# Patient Record
Sex: Female | Born: 1975
Health system: Southern US, Community
[De-identification: ages and names within clinical notes are randomized; demographics above are authoritative.]

## PROBLEM LIST (undated history)

## (undated) DIAGNOSIS — K219 Gastro-esophageal reflux disease without esophagitis: Secondary | ICD-10-CM

## (undated) DIAGNOSIS — E785 Hyperlipidemia, unspecified: Secondary | ICD-10-CM

## (undated) DIAGNOSIS — Z87442 Personal history of urinary calculi: Secondary | ICD-10-CM

## (undated) DIAGNOSIS — Z1509 Genetic susceptibility to other malignant neoplasm: Secondary | ICD-10-CM

## (undated) DIAGNOSIS — T7840XA Allergy, unspecified, initial encounter: Secondary | ICD-10-CM

## (undated) DIAGNOSIS — F419 Anxiety disorder, unspecified: Secondary | ICD-10-CM

## (undated) DIAGNOSIS — Z973 Presence of spectacles and contact lenses: Secondary | ICD-10-CM

## (undated) DIAGNOSIS — Z1507 Genetic susceptibility to malignant neoplasm of urinary tract: Secondary | ICD-10-CM

## (undated) HISTORY — DX: Anxiety disorder, unspecified: F41.9

## (undated) HISTORY — PX: DILATION AND CURETTAGE OF UTERUS: SHX78

## (undated) HISTORY — DX: Personal history of urinary calculi: Z87.442

## (undated) HISTORY — PX: BREAST ENHANCEMENT SURGERY: SHX7

## (undated) HISTORY — DX: Allergy, unspecified, initial encounter: T78.40XA

## (undated) HISTORY — PX: TONSILLECTOMY: SUR1361

## (undated) HISTORY — PX: BREAST IMPLANT REMOVAL: SUR1101

## (undated) HISTORY — DX: Hyperlipidemia, unspecified: E78.5

## (undated) HISTORY — PX: ABLATION: SHX5711

## (undated) HISTORY — DX: Gastro-esophageal reflux disease without esophagitis: K21.9

---

## 1993-11-27 HISTORY — PX: OTHER SURGICAL HISTORY: SHX169

## 2000-11-27 HISTORY — PX: DILATION AND CURETTAGE OF UTERUS: SHX78

## 2005-11-27 HISTORY — PX: BREAST ENHANCEMENT SURGERY: SHX7

## 2009-12-29 ENCOUNTER — Ambulatory Visit (HOSPITAL_COMMUNITY): Admission: RE | Admit: 2009-12-29 | Discharge: 2009-12-29 | Payer: Self-pay | Admitting: Obstetrics and Gynecology

## 2010-11-27 HISTORY — PX: ABLATION: SHX5711

## 2011-02-16 LAB — CBC
Hemoglobin: 11.9 g/dL — ABNORMAL LOW (ref 12.0–15.0)
MCHC: 33.7 g/dL (ref 30.0–36.0)
MCV: 88.2 fL (ref 78.0–100.0)
RDW: 13.3 % (ref 11.5–15.5)

## 2011-02-16 LAB — TYPE AND SCREEN: ABO/RH(D): A POS

## 2014-09-21 ENCOUNTER — Other Ambulatory Visit: Payer: Self-pay | Admitting: Obstetrics and Gynecology

## 2014-09-21 DIAGNOSIS — R928 Other abnormal and inconclusive findings on diagnostic imaging of breast: Secondary | ICD-10-CM

## 2014-10-06 ENCOUNTER — Ambulatory Visit
Admission: RE | Admit: 2014-10-06 | Discharge: 2014-10-06 | Disposition: A | Discharge status: Home / Self Care | Payer: No Typology Code available for payment source | Source: Ambulatory Visit | Attending: Obstetrics and Gynecology | Admitting: Obstetrics and Gynecology

## 2014-10-06 ENCOUNTER — Encounter (INDEPENDENT_AMBULATORY_CARE_PROVIDER_SITE_OTHER): Payer: Self-pay

## 2014-10-06 DIAGNOSIS — R928 Other abnormal and inconclusive findings on diagnostic imaging of breast: Secondary | ICD-10-CM

## 2014-11-27 HISTORY — PX: BREAST IMPLANT REMOVAL: SUR1101

## 2014-11-27 HISTORY — PX: AUGMENTATION MAMMAPLASTY: SUR837

## 2014-12-10 ENCOUNTER — Other Ambulatory Visit: Payer: Self-pay | Admitting: Dermatology

## 2016-12-01 ENCOUNTER — Other Ambulatory Visit: Payer: Self-pay | Admitting: Dermatology

## 2017-11-07 ENCOUNTER — Other Ambulatory Visit: Payer: Self-pay | Admitting: Obstetrics and Gynecology

## 2017-11-07 DIAGNOSIS — N644 Mastodynia: Secondary | ICD-10-CM

## 2017-11-07 DIAGNOSIS — N6310 Unspecified lump in the right breast, unspecified quadrant: Secondary | ICD-10-CM

## 2017-11-12 ENCOUNTER — Ambulatory Visit
Admission: RE | Admit: 2017-11-12 | Discharge: 2017-11-12 | Disposition: A | Discharge status: Home / Self Care | Payer: 59 | Source: Ambulatory Visit | Attending: Obstetrics and Gynecology | Admitting: Obstetrics and Gynecology

## 2017-11-12 ENCOUNTER — Other Ambulatory Visit: Payer: Self-pay

## 2017-11-12 DIAGNOSIS — N644 Mastodynia: Secondary | ICD-10-CM

## 2017-11-12 DIAGNOSIS — N6310 Unspecified lump in the right breast, unspecified quadrant: Secondary | ICD-10-CM

## 2018-02-11 ENCOUNTER — Encounter: Payer: Self-pay | Admitting: Gastroenterology

## 2018-03-26 ENCOUNTER — Ambulatory Visit: Payer: Self-pay | Admitting: Gastroenterology

## 2018-04-09 ENCOUNTER — Other Ambulatory Visit: Payer: Self-pay | Admitting: Family Medicine

## 2018-04-09 DIAGNOSIS — R1012 Left upper quadrant pain: Secondary | ICD-10-CM

## 2018-04-10 ENCOUNTER — Ambulatory Visit
Admission: RE | Admit: 2018-04-10 | Discharge: 2018-04-10 | Disposition: A | Discharge status: Home / Self Care | Payer: 59 | Source: Ambulatory Visit | Attending: Family Medicine | Admitting: Family Medicine

## 2018-04-10 ENCOUNTER — Other Ambulatory Visit: Payer: Self-pay | Admitting: Family Medicine

## 2018-04-10 DIAGNOSIS — R1032 Left lower quadrant pain: Secondary | ICD-10-CM

## 2018-04-10 DIAGNOSIS — R1012 Left upper quadrant pain: Secondary | ICD-10-CM

## 2018-05-16 ENCOUNTER — Encounter: Payer: Self-pay | Admitting: Gastroenterology

## 2018-05-16 ENCOUNTER — Encounter

## 2018-05-16 ENCOUNTER — Ambulatory Visit: Payer: 59 | Admitting: Gastroenterology

## 2018-05-16 VITALS — BP 120/66 | HR 76 | Ht 62.0 in | Wt 136.0 lb

## 2018-05-16 DIAGNOSIS — N200 Calculus of kidney: Secondary | ICD-10-CM

## 2018-05-16 DIAGNOSIS — R140 Abdominal distension (gaseous): Secondary | ICD-10-CM | POA: Diagnosis not present

## 2018-05-16 DIAGNOSIS — K824 Cholesterolosis of gallbladder: Secondary | ICD-10-CM

## 2018-05-16 DIAGNOSIS — R1032 Left lower quadrant pain: Secondary | ICD-10-CM

## 2018-05-16 DIAGNOSIS — K588 Other irritable bowel syndrome: Secondary | ICD-10-CM | POA: Diagnosis not present

## 2018-05-16 NOTE — Patient Instructions (Addendum)
You have been scheduled for a CT scan of the abdomen and pelvis at [facility] ([address]---this is in the same building as Press [facility]).   You are scheduled on 05/21/2018 at 11:15am. You should arrive 15 minutes prior to your appointment time for registration. Please follow the written instructions below on the day of your exam:  WARNING: IF YOU ARE ALLERGIC TO IODINE/X-RAY DYE, PLEASE NOTIFY RADIOLOGY IMMEDIATELY AT [phone]! YOU WILL BE GIVEN A 13 HOUR PREMEDICATION PREP.  1) Do not eat or drink anything after 7:15am (4 hours prior to your test) 2) You have been given 2 bottles of oral contrast to drink. The solution may taste better if refrigerated, but do NOT add ice or any other liquid to this solution. Shake well before drinking.    Drink 1 bottle of contrast @ 9:15am (2 hours prior to your exam)  Drink 1 bottle of contrast @ 10:15am (1 hour prior to your exam)  You may take any medications as prescribed with a small amount of water except for the following: Metformin, Glucophage, Glucovance, Avandamet, Riomet, Fortamet, Actoplus Met, Janumet, Glumetza or Metaglip. The above medications must be held the day of the exam AND 48 hours after the exam.  The purpose of you drinking the oral contrast is to aid in the visualization of your intestinal tract. The contrast solution may cause some diarrhea. Before your exam is started, you will be given a small amount of fluid to drink. Depending on your individual set of symptoms, you may also receive an intravenous injection of x-ray contrast/dye. Plan on being at [facility] for 30 minutes or longer, depending on the type of exam you are having performed.  This test typically takes 30-45 minutes to complete.  If you have any questions regarding your exam or if you need to reschedule, you may call the CT department at [phone] between the hours of 8:00 am and 5:00 pm,  Monday-Friday.  ________________________________________________________________________  We have given you IBGard samples, take 1 capsule three times a day as needed

## 2018-05-16 NOTE — Progress Notes (Signed)
Lauren Reynolds    [id number]    08-Jun-1976  Primary Care Physician:Helman, Viviann Spare, MD  Referring Physician: Rosalio Macadamia, MD [address] [place], [place] [address]  Chief complaint:  Abdominal pain, bloating  HPI: 42 year old female here for new patient visit with complaints of left-sided abdominal pain intermittently for the past 4 months.  She started having left lower quadrant abdominal pain since February 2019 with sometimes radiates down into her groin and also has had intermittent right lower quadrant abdominal pain.  Denies any association with activity or diet. No loss of appetite or weight loss.  No change in bowel habits, mucus or blood in stool.  Abdominal ultrasound Apr 10, 2018 showed 6 mm gallbladder polyp and nonobstructive 5 mm left renal calculus otherwise unremarkable exam. Abdominal x-ray and chest x-ray showed nonobstructive left renal calculi and mildly prominent stool in the proximal colon  No family history of GI malignancy  Outpatient Encounter Medications as of 05/16/2018  Medication Sig  . Cholecalciferol (VITAMIN D) 2000 units CAPS Take 1 capsule by mouth daily.  . fexofenadine (ALLEGRA) 180 MG tablet Take 180 mg by mouth daily.  . Nutritional Supplements (CHOLESTEROL DEFENSE PO) Take 2 tablets by mouth daily.   No facility-administered encounter medications on file as of 05/16/2018.     Allergies as of 05/16/2018 - Review Complete 05/16/2018  Allergen Reaction Noted  . Neosporin plus max st Rash 01/16/2013  . Benzalkonium chloride Rash 07/30/2015  . Codeine Nausea Only 01/16/2013    No past medical history on file.  Past Surgical History:  Procedure Laterality Date  . BREAST ENHANCEMENT SURGERY    . BREAST IMPLANT REMOVAL    . CESAREAN SECTION     x2    Family History  Problem Relation Age of Onset  . Breast cancer Maternal Aunt   . Esophageal cancer Maternal Uncle   . Breast cancer Paternal Aunt      Social History   Socioeconomic History  . Marital status: Married    Spouse name: Not on file  . Number of children: 2  . Years of education: Not on file  . Highest education level: Not on file  Occupational History  . Not on file  Social Needs  . Financial resource strain: Not on file  . Food insecurity:    Worry: Not on file    Inability: Not on file  . Transportation needs:    Medical: Not on file    Non-medical: Not on file  Tobacco Use  . Smoking status: Former Games developer  . Smokeless tobacco: Never Used  Substance and Sexual Activity  . Alcohol use: Yes  . Drug use: Never  . Sexual activity: Yes  Lifestyle  . Physical activity:    Days per week: Not on file    Minutes per session: Not on file  . Stress: Not on file  Relationships  . Social connections:    Talks on phone: Not on file    Gets together: Not on file    Attends religious service: Not on file    Active member of club or organization: Not on file    Attends meetings of clubs or organizations: Not on file    Relationship status: Not on file  . Intimate partner violence:    Fear of current or ex partner: Not on file    Emotionally abused: Not on file    Physically abused: Not on  file    Forced sexual activity: Not on file  Other Topics Concern  . Not on file  Social History Narrative  . Not on file      Review of systems: Review of Systems  Constitutional: Negative for fever and chills.  HENT: Negative.   Eyes: Negative for blurred vision.  Respiratory: Negative for cough, shortness of breath and wheezing.   Cardiovascular: Negative for chest pain and palpitations.  Gastrointestinal: as per HPI Genitourinary: Negative for dysuria, urgency, frequency and hematuria.  Musculoskeletal: Negative for myalgias, back pain and joint pain.  Skin: Negative for itching and rash.  Neurological: Negative for dizziness, tremors, focal weakness, seizures and loss of consciousness.  Endo/Heme/Allergies:  Positive for seasonal allergies.  Psychiatric/Behavioral: Negative for depression, suicidal ideas and hallucinations.  All other systems reviewed and are negative.   Physical Exam: Vitals:   05/16/18 0822  BP: 120/66  Pulse: 76   Body mass index is 24.87 kg/m. Gen:      No acute distress HEENT:  EOMI, sclera anicteric Neck:     No masses; no thyromegaly Lungs:    Clear to auscultation bilaterally; normal respiratory effort CV:         Regular rate and rhythm; no murmurs Abd:      + bowel sounds; soft, non-tender; no palpable masses, no distension Ext:    No edema; adequate peripheral perfusion Skin:      Warm and dry; no rash Neuro: alert and oriented x 3 Psych: normal mood and affect  Data Reviewed:  Reviewed labs, radiology imaging, old records and pertinent past GI work up  Abdominal X-ray 04/10/2018 Normal heart size, mediastinal contours, and pulmonary vascularity.  Mild central peribronchial thickening.  Lungs otherwise clear.  No infiltrate, pleural effusion or pneumothorax.  Prominent stool LEFT colon, scattered throughout remainder of colon.  Small bowel gas pattern normal.  No bowel dilatation, bowel wall thickening or free air.  Multiple tiny non-obstructing LEFT renal calculi.  No definite ureteral calcifications.  Tiny calcification in the inferior LEFT pelvis favor phleboliths.  Bones demineralized.  Abdominal Ultrasound 04/10/2018 Gallbladder: Dependent nonshadowing focus within gallbladder, non mobile, likely small polyp 6 mm diameter. No gallbladder wall thickening, pericholecystic fluid or sonographic Murphy sign.  Common bile duct: Diameter: 5 mm diameter, normal  Liver: Normal appearance. No focal hepatic mass or nodularity. Portal vein is patent on color Doppler imaging with normal direction of blood flow towards the liver.  IVC: Normal appearance  Pancreas: Normal appearance  Spleen: Normal appearance, 6.0 cm  length  Right Kidney: Length: 10.6 cm. Normal morphology without mass or hydronephrosis.  Left Kidney: Length: 10.9 cm. Echogenic focus with questionable shadowing mid inferior LEFT kidney question 5 mm nonobstructing calculus. No mass or hydronephrosis.  Abdominal aorta: Normal caliber  Other findings: No free fluid  Assessment and Plan/Recommendations:  42 year old female with recent onset left lower quadrant abdominal pain radiating to the groin  Abdominal ultrasound and x-ray showed renal calculi otherwise unremarkable.  Unclear etiology for recent onset left lower quadrant abdominal pain Will request CT abdomen pelvis with contrast to further evaluate  Abdominal ultrasound showed small 6 mm gallbladder polyp: We will schedule repeat abdominal ultrasound in 1 year for follow-up, if no change in size we will not recommend additional follow-up  Irritable bowel syndrome, constipation and bloating Increase fluid intake and fiber Trial of IBgard 1 capsule up to 3 times daily as needed  Greater than 50% of the time used for counseling as well  as treatment plan and follow-up. She had multiple questions which were answered to her satisfaction  K. Scherry RanVeena Kayelynn Abdou , MD [phone]    CC: Rosalio MacadamiaHelman, Steven, MD

## 2018-05-21 ENCOUNTER — Ambulatory Visit (INDEPENDENT_AMBULATORY_CARE_PROVIDER_SITE_OTHER)
Admission: RE | Admit: 2018-05-21 | Discharge: 2018-05-21 | Disposition: A | Discharge status: Home / Self Care | Payer: 59 | Source: Ambulatory Visit | Attending: Gastroenterology | Admitting: Gastroenterology

## 2018-05-21 DIAGNOSIS — K588 Other irritable bowel syndrome: Secondary | ICD-10-CM | POA: Diagnosis not present

## 2018-05-21 DIAGNOSIS — R140 Abdominal distension (gaseous): Secondary | ICD-10-CM

## 2018-05-21 DIAGNOSIS — R1032 Left lower quadrant pain: Secondary | ICD-10-CM | POA: Diagnosis not present

## 2018-05-21 DIAGNOSIS — K824 Cholesterolosis of gallbladder: Secondary | ICD-10-CM

## 2018-05-23 ENCOUNTER — Encounter: Payer: Self-pay | Admitting: Gastroenterology

## 2018-06-07 ENCOUNTER — Telehealth: Payer: Self-pay | Admitting: Gastroenterology

## 2018-06-07 NOTE — Telephone Encounter (Signed)
Pt instructed to call [facility] to reschedule the appt.

## 2018-06-07 NOTE — Telephone Encounter (Signed)
Pt called to let you know that she needs to r/s appt with urologist scheduled next week on 06/13/18.

## 2019-01-02 DIAGNOSIS — E559 Vitamin D deficiency, unspecified: Secondary | ICD-10-CM | POA: Diagnosis not present

## 2019-01-02 DIAGNOSIS — R7309 Other abnormal glucose: Secondary | ICD-10-CM | POA: Diagnosis not present

## 2019-01-02 DIAGNOSIS — Z01419 Encounter for gynecological examination (general) (routine) without abnormal findings: Secondary | ICD-10-CM | POA: Diagnosis not present

## 2019-01-02 DIAGNOSIS — R5383 Other fatigue: Secondary | ICD-10-CM | POA: Diagnosis not present

## 2019-01-02 DIAGNOSIS — Z6824 Body mass index (BMI) 24.0-24.9, adult: Secondary | ICD-10-CM | POA: Diagnosis not present

## 2019-01-02 DIAGNOSIS — E785 Hyperlipidemia, unspecified: Secondary | ICD-10-CM | POA: Diagnosis not present

## 2019-01-02 DIAGNOSIS — Z1231 Encounter for screening mammogram for malignant neoplasm of breast: Secondary | ICD-10-CM | POA: Diagnosis not present

## 2019-05-26 ENCOUNTER — Telehealth: Payer: Self-pay | Admitting: *Deleted

## 2019-05-26 DIAGNOSIS — K824 Cholesterolosis of gallbladder: Secondary | ICD-10-CM

## 2019-05-26 NOTE — Telephone Encounter (Signed)
===  View-only below this line=== ----- Message ----- From: Oda Kilts, CMA Sent: 04/28/2019 To: Oda Kilts, CMA Subject: [place] Recall                                      1 year recall ultrasound for gallbladder polyp

## 2019-05-26 NOTE — Telephone Encounter (Signed)
You have been scheduled for an abdominal ultrasound at [facility] (1st floor of hospital) on 06/03/2019 at [place]. Please arrive 15 minutes prior to your appointment for registration. Make certain not to have anything to eat or drink 6 hours prior to your appointment. Should you need to reschedule your appointment, please contact radiology at [phone]. This test typically takes about 30 minutes to perform.

## 2019-06-03 ENCOUNTER — Other Ambulatory Visit: Payer: Self-pay

## 2019-06-03 ENCOUNTER — Ambulatory Visit (HOSPITAL_COMMUNITY)
Admission: RE | Admit: 2019-06-03 | Discharge: 2019-06-03 | Disposition: A | Discharge status: Home / Self Care | Payer: BLUE CROSS/BLUE SHIELD | Source: Ambulatory Visit | Attending: Gastroenterology | Admitting: Gastroenterology

## 2019-06-03 DIAGNOSIS — K824 Cholesterolosis of gallbladder: Secondary | ICD-10-CM | POA: Diagnosis not present

## 2019-06-06 ENCOUNTER — Telehealth: Payer: Self-pay | Admitting: *Deleted

## 2019-06-06 NOTE — Telephone Encounter (Signed)
Spoke to patient, scheduled virtual visit for 07/22/2019 at 11:00 am.

## 2019-06-06 NOTE — Telephone Encounter (Signed)
This patient wanted to know what the plan of care was from here. Do you want to schedule another recall abd [place] for a year out, do you want to see this patient in the office to discuss? Please advise.

## 2019-06-06 NOTE — Telephone Encounter (Signed)
Please schedule a follow-up virtual visit to discuss her questions and concerns.  Radiology did not recommend follow-up ultrasound as the size of the polyp was stable.

## 2019-06-23 DIAGNOSIS — N951 Menopausal and female climacteric states: Secondary | ICD-10-CM | POA: Diagnosis not present

## 2019-06-23 DIAGNOSIS — N950 Postmenopausal bleeding: Secondary | ICD-10-CM | POA: Diagnosis not present

## 2019-07-22 ENCOUNTER — Other Ambulatory Visit: Payer: Self-pay

## 2019-07-22 ENCOUNTER — Ambulatory Visit (INDEPENDENT_AMBULATORY_CARE_PROVIDER_SITE_OTHER): Payer: BLUE CROSS/BLUE SHIELD | Admitting: Gastroenterology

## 2019-07-22 ENCOUNTER — Encounter: Payer: Self-pay | Admitting: Gastroenterology

## 2019-07-22 VITALS — Ht 62.0 in

## 2019-07-22 DIAGNOSIS — K824 Cholesterolosis of gallbladder: Secondary | ICD-10-CM | POA: Diagnosis not present

## 2019-07-22 DIAGNOSIS — R140 Abdominal distension (gaseous): Secondary | ICD-10-CM | POA: Diagnosis not present

## 2019-07-22 DIAGNOSIS — K588 Other irritable bowel syndrome: Secondary | ICD-10-CM | POA: Diagnosis not present

## 2019-07-22 DIAGNOSIS — R109 Unspecified abdominal pain: Secondary | ICD-10-CM

## 2019-07-22 NOTE — Patient Instructions (Addendum)
Follow-up abdominal ultrasound recall July 2021  Follow-up office visit in 1 year  Continue IBgard as needed  I appreciate the  opportunity to care for you  Thank You   Harl Bowie , MD

## 2019-07-22 NOTE — Progress Notes (Signed)
Lauren Reynolds    [id number]    1976-03-21  Primary Care Physician:Helman, Remo Lipps, MD  Referring Physician: Meda Klinefelter, MD No address on file  This service was provided via  telemedicine due to [place] 19 pandemic.  I connected with@ on 07/22/19 at 11:00 AM EDT by a video enabled telemedicine application and verified that I am speaking with the correct person using two identifiers.  Patient location: Home Provider location: Office   I discussed the limitations, risks, security and privacy concerns of performing an evaluation and management service by video enabled telemedicine application and the availability of in person appointments. I also discussed with the patient that there may be a patient responsible charge related to this service. The patient expressed understanding and agreed to proceed.   The persons participating in this telemedicine service were myself and the patient    Chief complaint: Gallbladder polyp HPI: 43 year old female here for follow-up visit  Abdominal ultrasound Apr 10, 2018 showed 6 mm gallbladder polyp and nonobstructive 5 mm left renal calculus otherwise unremarkable exam. Abdominal x-ray and chest x-ray showed nonobstructive left renal calculi and mildly prominent stool in the proximal colon  Repeat abdominal ultrasound July 2020 1. The gallbladder polyp again measures 5.8 mm, not significantly changed. By protocol, no imaging follow-up is recommended. 2. No other significant abnormalities.  She is doing well overall, no specific complaints.  Abdominal bloating and cramping intermittent improves with IBgard as needed.  Denies any nausea, vomiting, abdominal pain, melena or bright red blood per rectum   Outpatient Encounter Medications as of 07/22/2019  Medication Sig  . Cholecalciferol (VITAMIN D) 2000 units CAPS Take 1 capsule by mouth daily.  . fexofenadine (ALLEGRA) 180 MG tablet Take 180 mg by mouth daily.  .  Nutritional Supplements (CHOLESTEROL DEFENSE PO) Take 2 tablets by mouth daily.   No facility-administered encounter medications on file as of 07/22/2019.     Allergies as of 07/22/2019 - Review Complete 05/23/2018  Allergen Reaction Noted  . Neosporin plus max st Rash 01/16/2013  . Benzalkonium chloride Rash 07/30/2015  . Codeine Nausea Only 01/16/2013    No past medical history on file.  Past Surgical History:  Procedure Laterality Date  . BREAST ENHANCEMENT SURGERY    . BREAST IMPLANT REMOVAL    . CESAREAN SECTION     x2    Family History  Problem Relation Age of Onset  . Breast cancer Maternal Aunt   . Esophageal cancer Maternal Uncle   . Breast cancer Paternal Aunt     Social History   Socioeconomic History  . Marital status: Married    Spouse name: Not on file  . Number of children: 2  . Years of education: Not on file  . Highest education level: Not on file  Occupational History  . Not on file  Social Needs  . Financial resource strain: Not on file  . Food insecurity    Worry: Not on file    Inability: Not on file  . Transportation needs    Medical: Not on file    Non-medical: Not on file  Tobacco Use  . Smoking status: Former Research scientist (life sciences)  . Smokeless tobacco: Never Used  Substance and Sexual Activity  . Alcohol use: Yes  . Drug use: Never  . Sexual activity: Yes  Lifestyle  . Physical activity    Days per week: Not on file    Minutes per session: Not on file  .  Stress: Not on file  Relationships  . Social Musicianconnections    Talks on phone: Not on file    Gets together: Not on file    Attends religious service: Not on file    Active member of club or organization: Not on file    Attends meetings of clubs or organizations: Not on file    Relationship status: Not on file  . Intimate partner violence    Fear of current or ex partner: Not on file    Emotionally abused: Not on file    Physically abused: Not on file    Forced sexual activity: Not on file   Other Topics Concern  . Not on file  Social History Narrative  . Not on file      Review of systems: Review of Systems as per HPI All other systems reviewed and are negative.   Observations/Objective:   Data Reviewed:  Reviewed labs, radiology imaging, old records and pertinent past GI work up   Assessment and Plan/Recommendations:  43 year old female with irritable bowel syndrome and gallbladder polyp 5 to 6 mm in size stable compared to last year  We will repeat abdominal ultrasound in 1 year for follow-up of gallbladder polyp, if remains stable repeat ultrasound in 3 to 5 years  IBS: Continue IBgard as needed  Return office visit as needed  I discussed the assessment and treatment plan with the patient. The patient was provided an opportunity to ask questions and all were answered. The patient agreed with the plan and demonstrated an understanding of the instructions.   The patient was advised to call back or seek an in-person evaluation if the symptoms worsen or if the condition fails to improve as anticipated.  I provided 15 minutes of non-face-to-face time during this encounter.   Marsa ArisKavitha Cristal Qadir, MD   CC: Rosalio MacadamiaHelman, Steven, MD

## 2019-07-24 ENCOUNTER — Encounter: Payer: Self-pay | Admitting: Gastroenterology

## 2019-11-26 ENCOUNTER — Other Ambulatory Visit: Payer: Self-pay | Admitting: Radiology

## 2019-11-26 DIAGNOSIS — N63 Unspecified lump in unspecified breast: Secondary | ICD-10-CM

## 2019-11-26 DIAGNOSIS — N644 Mastodynia: Secondary | ICD-10-CM | POA: Diagnosis not present

## 2019-12-02 ENCOUNTER — Telehealth: Payer: Self-pay

## 2019-12-02 NOTE — Telephone Encounter (Signed)
Would you be willing to accept as a new patient?   Copied from CRM [id number]. Topic: Appointment Scheduling - Prior Auth Required for Appointment >> Dec 02, 2019  8:29 AM Reggie Pile, NT wrote: No appointment has been scheduled. Patient is requesting new pt with Dr.Burns appointment. Per scheduling protocol, this appointment requires a prior authorization prior to scheduling. Pt was recommended to her by her gastro MD.  Route to department's PEC pool.

## 2019-12-03 NOTE — Telephone Encounter (Signed)
Yes, I will accept her 

## 2019-12-04 NOTE — Telephone Encounter (Signed)
Patient scheduled for 12/17/2019 at 9:30am

## 2019-12-15 NOTE — Patient Instructions (Addendum)
Blood work was ordered.    All other Health Maintenance issues reviewed.   All recommended immunizations and age-appropriate screenings are up-to-date or discussed.  No immunization administered today.   Medications reviewed and updated.  Changes include :   none  Your prescription(s) have been submitted to your pharmacy. Please take as directed and contact our office if you believe you are having problem(s) with the medication(s).    Please followup in 1 year    Health Maintenance, Female Adopting a healthy lifestyle and getting preventive care are important in promoting health and wellness. Ask your health care provider about:  The right schedule for you to have regular tests and exams.  Things you can do on your own to prevent diseases and keep yourself healthy. What should I know about diet, weight, and exercise? Eat a healthy diet   Eat a diet that includes plenty of vegetables, fruits, low-fat dairy products, and lean protein.  Do not eat a lot of foods that are high in solid fats, added sugars, or sodium. Maintain a healthy weight Body mass index (BMI) is used to identify weight problems. It estimates body fat based on height and weight. Your health care provider can help determine your BMI and help you achieve or maintain a healthy weight. Get regular exercise Get regular exercise. This is one of the most important things you can do for your health. Most adults should:  Exercise for at least 150 minutes each week. The exercise should increase your heart rate and make you sweat (moderate-intensity exercise).  Do strengthening exercises at least twice a week. This is in addition to the moderate-intensity exercise.  Spend less time sitting. Even light physical activity can be beneficial. Watch cholesterol and blood lipids Have your blood tested for lipids and cholesterol at 46 years of age, then have this test every 5 years. Have your cholesterol levels checked more  often if:  Your lipid or cholesterol levels are high.  You are older than 46 years of age.  You are at high risk for heart disease. What should I know about cancer screening? Depending on your health history and family history, you may need to have cancer screening at various ages. This may include screening for:  Breast cancer.  Cervical cancer.  Colorectal cancer.  Skin cancer.  Lung cancer. What should I know about heart disease, diabetes, and high blood pressure? Blood pressure and heart disease  High blood pressure causes heart disease and increases the risk of stroke. This is more likely to develop in people who have high blood pressure readings, are of African descent, or are overweight.  Have your blood pressure checked: ? Every 3-5 years if you are 18-39 years of age. ? Every year if you are 40 years old or older. Diabetes Have regular diabetes screenings. This checks your fasting blood sugar level. Have the screening done:  Once every three years after age 40 if you are at a normal weight and have a low risk for diabetes.  More often and at a younger age if you are overweight or have a high risk for diabetes. What should I know about preventing infection? Hepatitis B If you have a higher risk for hepatitis B, you should be screened for this virus. Talk with your health care provider to find out if you are at risk for hepatitis B infection. Hepatitis C Testing is recommended for:  Everyone born from 1945 through 1965.  Anyone with known risk factors for hepatitis   C. Sexually transmitted infections (STIs)  Get screened for STIs, including gonorrhea and chlamydia, if: ? You are sexually active and are younger than 46 years of age. ? You are older than 46 years of age and your health care provider tells you that you are at risk for this type of infection. ? Your sexual activity has changed since you were last screened, and you are at increased risk for chlamydia  or gonorrhea. Ask your health care provider if you are at risk.  Ask your health care provider about whether you are at high risk for HIV. Your health care provider may recommend a prescription medicine to help prevent HIV infection. If you choose to take medicine to prevent HIV, you should first get tested for HIV. You should then be tested every 3 months for as long as you are taking the medicine. Pregnancy  If you are about to stop having your period (premenopausal) and you may become pregnant, seek counseling before you get pregnant.  Take 400 to 800 micrograms (mcg) of folic acid every day if you become pregnant.  Ask for birth control (contraception) if you want to prevent pregnancy. Osteoporosis and menopause Osteoporosis is a disease in which the bones lose minerals and strength with aging. This can result in bone fractures. If you are 65 years old or older, or if you are at risk for osteoporosis and fractures, ask your health care provider if you should:  Be screened for bone loss.  Take a calcium or vitamin D supplement to lower your risk of fractures.  Be given hormone replacement therapy (HRT) to treat symptoms of menopause. Follow these instructions at home: Lifestyle  Do not use any products that contain nicotine or tobacco, such as cigarettes, e-cigarettes, and chewing tobacco. If you need help quitting, ask your health care provider.  Do not use street drugs.  Do not share needles.  Ask your health care provider for help if you need support or information about quitting drugs. Alcohol use  Do not drink alcohol if: ? Your health care provider tells you not to drink. ? You are pregnant, may be pregnant, or are planning to become pregnant.  If you drink alcohol: ? Limit how much you use to 0-1 drink a day. ? Limit intake if you are breastfeeding.  Be aware of how much alcohol is in your drink. In the [place] one drink equals one 12 oz bottle of beer (355 mL), one 5 oz  glass of wine (148 mL), or one 1 oz glass of hard liquor (44 mL). General instructions  Schedule regular health, dental, and eye exams.  Stay current with your vaccines.  Tell your health care provider if: ? You often feel depressed. ? You have ever been abused or do not feel safe at home. Summary  Adopting a healthy lifestyle and getting preventive care are important in promoting health and wellness.  Follow your health care provider's instructions about healthy diet, exercising, and getting tested or screened for diseases.  Follow your health care provider's instructions on monitoring your cholesterol and blood pressure. This information is not intended to replace advice given to you by your health care provider. Make sure you discuss any questions you have with your health care provider. Document Revised: 11/06/2018 Document Reviewed: 11/06/2018 Elsevier Patient Education  2020 [employer]  

## 2019-12-15 NOTE — Progress Notes (Signed)
Subjective:    Patient ID: Lauren Reynolds, female    DOB: 03-06-1976, 44 y.o.   MRN: [id number]  HPI  She is here to establish with a new pcp.  She is here for a physical exam.   She has occasional pain from her right ear radiating down neck.  Not sure if it is related to grinding her teeth or not.  Feels it during day, not first thing in the mronig.  No swelling or lumps.  Wears mouth guard at night.   No other concerns.  Her 46 year old will be going away for boarding school in a couple of weeks.  Her son is 42.    Medications and allergies reviewed with patient and updated if appropriate.  There are no problems to display for this patient.   Current Outpatient Medications on File Prior to Visit  Medication Sig Dispense Refill  . Cholecalciferol (VITAMIN D) 2000 units CAPS Take 1 capsule by mouth daily.    . fexofenadine (ALLEGRA) 180 MG tablet Take 180 mg by mouth daily.     No current facility-administered medications on file prior to visit.    Past Medical History:  Diagnosis Date  . History of kidney stones     Past Surgical History:  Procedure Laterality Date  . ABLATION    . AUGMENTATION MAMMAPLASTY Bilateral 2016   Implants removed 2016  . BREAST ENHANCEMENT SURGERY    . BREAST IMPLANT REMOVAL    . CESAREAN SECTION     x2    Social History   Socioeconomic History  . Marital status: Married    Spouse name: Not on file  . Number of children: 2  . Years of education: Not on file  . Highest education level: Not on file  Occupational History  . Not on file  Tobacco Use  . Smoking status: Former Research scientist (life sciences)  . Smokeless tobacco: Never Used  Substance and Sexual Activity  . Alcohol use: Yes  . Drug use: Never  . Sexual activity: Yes  Other Topics Concern  . Not on file  Social History Narrative  . Not on file   Social Determinants of Health   Financial Resource Strain:   . Difficulty of Paying Living Expenses: Not on file  Food Insecurity:   .  Worried About Charity fundraiser in the Last Year: Not on file  . Ran Out of Food in the Last Year: Not on file  Transportation Needs:   . Lack of Transportation (Medical): Not on file  . Lack of Transportation (Non-Medical): Not on file  Physical Activity:   . Days of Exercise per Week: Not on file  . Minutes of Exercise per Session: Not on file  Stress:   . Feeling of Stress : Not on file  Social Connections:   . Frequency of Communication with Friends and Family: Not on file  . Frequency of Social Gatherings with Friends and Family: Not on file  . Attends Religious Services: Not on file  . Active Member of Clubs or Organizations: Not on file  . Attends Archivist Meetings: Not on file  . Marital Status: Not on file    Family History  Problem Relation Age of Onset  . Cancer Mother   . Breast cancer Maternal Aunt   . Esophageal cancer Maternal Uncle   . Breast cancer Paternal Aunt   . Cancer Paternal Grandmother     Review of Systems  Constitutional: Negative for chills,  fatigue and fever.       Occasional hot flashes  Eyes: Negative for visual disturbance.  Respiratory: Negative for cough, shortness of breath and wheezing.   Cardiovascular: Positive for palpitations (with stress - rare). Negative for chest pain and leg swelling.  Gastrointestinal: Negative for abdominal pain, blood in stool, constipation, diarrhea and nausea.  Genitourinary: Negative for dysuria and hematuria.  Musculoskeletal: Negative for arthralgias and back pain.  Skin: Negative for color change and rash.  Neurological: Negative for dizziness, light-headedness and headaches.  Psychiatric/Behavioral: Positive for dysphoric mood (mild, controlled with natural things) and sleep disturbance (takes natural supplement). The patient is nervous/anxious (mild, controlled with natural things).        Objective:   Vitals:   12/17/19 0924  BP: 134/82  Pulse: 83  Resp: 16  Temp: 98.9 F (37.2 C)   SpO2: 97%   Filed Weights   12/17/19 0924  Weight: 133 lb (60.3 kg)   Body mass index is 24.33 kg/m.  BP Readings from Last 3 Encounters:  12/17/19 134/82  05/16/18 120/66    Wt Readings from Last 3 Encounters:  12/17/19 133 lb (60.3 kg)  05/16/18 136 lb (61.7 kg)     Physical Exam Constitutional: She appears well-developed and well-nourished. No distress.  HENT:  Head: Normocephalic and atraumatic.  Right Ear: External ear normal. Normal ear canal and TM Left Ear: External ear normal.  Normal ear canal and TM Mouth/Throat: Oropharynx is clear and moist.  Eyes: Conjunctivae and EOM are normal.  Neck: Neck supple. No tracheal deviation present. No thyromegaly present.  No carotid bruit  Cardiovascular: Normal rate, regular rhythm and normal heart sounds.   No murmur heard.  No edema. Pulmonary/Chest: Effort normal and breath sounds normal. No respiratory distress. She has no wheezes. She has no rales.  Breast: deferred   Abdominal: Soft. She exhibits no distension. There is no tenderness.  Lymphadenopathy: She has no cervical adenopathy.  Skin: Skin is warm and dry. She is not diaphoretic.  Psychiatric: She has a normal mood and affect. Her behavior is normal.        Assessment & Plan:   Physical exam: Screening blood work    ordered Immunizations  tdap up to date, flu vaccine due Mammogram   Scheduled for 11/2019 Gyn  Up to date  Eye exams  Up to date  Exercise  Running, yoga, some weights Weight  Normal BMI Substance abuse  none  See Problem List for Assessment and Plan of chronic medical problems.     This visit occurred during the SARS-CoV-2 public health emergency.  Safety protocols were in place, including screening questions prior to the visit, additional usage of staff PPE, and extensive cleaning of exam room while observing appropriate contact time as indicated for disinfecting solutions.     FU in one year

## 2019-12-16 ENCOUNTER — Other Ambulatory Visit: Payer: Self-pay

## 2019-12-16 ENCOUNTER — Ambulatory Visit
Admission: RE | Admit: 2019-12-16 | Discharge: 2019-12-16 | Disposition: A | Discharge status: Home / Self Care | Payer: BLUE CROSS/BLUE SHIELD | Source: Ambulatory Visit | Attending: Radiology | Admitting: Radiology

## 2019-12-16 ENCOUNTER — Ambulatory Visit: Payer: BLUE CROSS/BLUE SHIELD

## 2019-12-16 DIAGNOSIS — R922 Inconclusive mammogram: Secondary | ICD-10-CM | POA: Diagnosis not present

## 2019-12-16 DIAGNOSIS — N63 Unspecified lump in unspecified breast: Secondary | ICD-10-CM

## 2019-12-16 DIAGNOSIS — N6012 Diffuse cystic mastopathy of left breast: Secondary | ICD-10-CM | POA: Diagnosis not present

## 2019-12-17 ENCOUNTER — Encounter: Payer: Self-pay | Admitting: Internal Medicine

## 2019-12-17 ENCOUNTER — Other Ambulatory Visit: Payer: Self-pay

## 2019-12-17 ENCOUNTER — Ambulatory Visit (INDEPENDENT_AMBULATORY_CARE_PROVIDER_SITE_OTHER): Payer: BLUE CROSS/BLUE SHIELD | Admitting: Internal Medicine

## 2019-12-17 VITALS — BP 134/82 | HR 83 | Temp 98.9°F | Resp 16 | Ht 62.0 in | Wt 133.0 lb

## 2019-12-17 DIAGNOSIS — E782 Mixed hyperlipidemia: Secondary | ICD-10-CM | POA: Diagnosis not present

## 2019-12-17 DIAGNOSIS — E7800 Pure hypercholesterolemia, unspecified: Secondary | ICD-10-CM | POA: Insufficient documentation

## 2019-12-17 DIAGNOSIS — J302 Other seasonal allergic rhinitis: Secondary | ICD-10-CM

## 2019-12-17 DIAGNOSIS — Z0000 Encounter for general adult medical examination without abnormal findings: Secondary | ICD-10-CM | POA: Diagnosis not present

## 2019-12-17 DIAGNOSIS — E785 Hyperlipidemia, unspecified: Secondary | ICD-10-CM | POA: Insufficient documentation

## 2019-12-17 DIAGNOSIS — L719 Rosacea, unspecified: Secondary | ICD-10-CM | POA: Insufficient documentation

## 2019-12-17 DIAGNOSIS — K824 Cholesterolosis of gallbladder: Secondary | ICD-10-CM | POA: Insufficient documentation

## 2019-12-17 DIAGNOSIS — Z87442 Personal history of urinary calculi: Secondary | ICD-10-CM | POA: Insufficient documentation

## 2019-12-17 DIAGNOSIS — J309 Allergic rhinitis, unspecified: Secondary | ICD-10-CM | POA: Insufficient documentation

## 2019-12-17 NOTE — Assessment & Plan Note (Signed)
Controlled with otc antihistamine  

## 2019-12-17 NOTE — Assessment & Plan Note (Signed)
Chronic Borderline in past Genetic Will start natural supplement Exercises regularly

## 2019-12-18 ENCOUNTER — Other Ambulatory Visit (INDEPENDENT_AMBULATORY_CARE_PROVIDER_SITE_OTHER): Payer: BLUE CROSS/BLUE SHIELD

## 2019-12-18 DIAGNOSIS — E782 Mixed hyperlipidemia: Secondary | ICD-10-CM

## 2019-12-18 DIAGNOSIS — Z0000 Encounter for general adult medical examination without abnormal findings: Secondary | ICD-10-CM | POA: Diagnosis not present

## 2019-12-18 LAB — LIPID PANEL
Cholesterol: 244 mg/dL — ABNORMAL HIGH (ref 0–200)
HDL: 79.3 mg/dL (ref >39.00)
LDL Cholesterol: 154 mg/dL — ABNORMAL HIGH (ref 0–99)
NonHDL: 164.4
Total CHOL/HDL Ratio: 3
Triglycerides: 53 mg/dL (ref 0.0–149.0)
VLDL: 10.6 mg/dL (ref 0.0–40.0)

## 2019-12-18 LAB — COMPREHENSIVE METABOLIC PANEL
ALT: 12 U/L (ref 0–35)
AST: 16 U/L (ref 0–37)
Albumin: 4.4 g/dL (ref 3.5–5.2)
Alkaline Phosphatase: 36 U/L — ABNORMAL LOW (ref 39–117)
BUN: 15 mg/dL (ref 6–23)
CO2: 29 mEq/L (ref 19–32)
Calcium: 9.1 mg/dL (ref 8.4–10.5)
Chloride: 104 mEq/L (ref 96–112)
Creatinine, Ser: 0.7 mg/dL (ref 0.40–1.20)
GFR: 90.18 mL/min (ref >60.00)
Glucose, Bld: 88 mg/dL (ref 70–99)
Potassium: 4.2 mEq/L (ref 3.5–5.1)
Sodium: 137 mEq/L (ref 135–145)
Total Bilirubin: 0.5 mg/dL (ref 0.2–1.2)
Total Protein: 7.1 g/dL (ref 6.0–8.3)

## 2019-12-18 LAB — CBC WITH DIFFERENTIAL/PLATELET
Basophils Absolute: 0 10*3/uL (ref 0.0–0.1)
Basophils Relative: 0.4 % (ref 0.0–3.0)
Eosinophils Absolute: 0.2 10*3/uL (ref 0.0–0.7)
Eosinophils Relative: 3.5 % (ref 0.0–5.0)
HCT: 37.7 % (ref 36.0–46.0)
Hemoglobin: 12.4 g/dL (ref 12.0–15.0)
Lymphocytes Relative: 32.7 % (ref 12.0–46.0)
Lymphs Abs: 1.8 10*3/uL (ref 0.7–4.0)
MCHC: 32.9 g/dL (ref 30.0–36.0)
MCV: 89.5 fl (ref 78.0–100.0)
Monocytes Absolute: 0.5 10*3/uL (ref 0.1–1.0)
Monocytes Relative: 8.7 % (ref 3.0–12.0)
Neutro Abs: 3.1 10*3/uL (ref 1.4–7.7)
Neutrophils Relative %: 54.7 % (ref 43.0–77.0)
Platelets: 242 10*3/uL (ref 150.0–400.0)
RBC: 4.21 Mil/uL (ref 3.87–5.11)
RDW: 13.3 % (ref 11.5–15.5)
WBC: 5.6 10*3/uL (ref 4.0–10.5)

## 2019-12-18 LAB — TSH: TSH: 3.01 u[IU]/mL (ref 0.35–4.50)

## 2019-12-19 ENCOUNTER — Encounter: Payer: Self-pay | Admitting: Internal Medicine

## 2020-01-28 DIAGNOSIS — Z6824 Body mass index (BMI) 24.0-24.9, adult: Secondary | ICD-10-CM | POA: Diagnosis not present

## 2020-01-28 DIAGNOSIS — Z01419 Encounter for gynecological examination (general) (routine) without abnormal findings: Secondary | ICD-10-CM | POA: Diagnosis not present

## 2020-01-28 DIAGNOSIS — Z803 Family history of malignant neoplasm of breast: Secondary | ICD-10-CM | POA: Diagnosis not present

## 2020-01-28 LAB — HM PAP SMEAR

## 2020-01-30 ENCOUNTER — Other Ambulatory Visit: Payer: Self-pay | Admitting: Obstetrics and Gynecology

## 2020-01-30 DIAGNOSIS — N6320 Unspecified lump in the left breast, unspecified quadrant: Secondary | ICD-10-CM

## 2020-01-30 DIAGNOSIS — N6310 Unspecified lump in the right breast, unspecified quadrant: Secondary | ICD-10-CM

## 2020-02-09 ENCOUNTER — Other Ambulatory Visit: Payer: Self-pay | Admitting: Obstetrics and Gynecology

## 2020-02-09 DIAGNOSIS — Z9189 Other specified personal risk factors, not elsewhere classified: Secondary | ICD-10-CM

## 2020-02-12 ENCOUNTER — Other Ambulatory Visit: Payer: BLUE CROSS/BLUE SHIELD

## 2020-03-10 DIAGNOSIS — Z9189 Other specified personal risk factors, not elsewhere classified: Secondary | ICD-10-CM | POA: Diagnosis not present

## 2020-06-01 ENCOUNTER — Other Ambulatory Visit: Payer: BLUE CROSS/BLUE SHIELD

## 2020-07-01 ENCOUNTER — Other Ambulatory Visit: Payer: Self-pay

## 2020-07-01 ENCOUNTER — Ambulatory Visit
Admission: RE | Admit: 2020-07-01 | Discharge: 2020-07-01 | Disposition: A | Discharge status: Home / Self Care | Payer: BLUE CROSS/BLUE SHIELD | Source: Ambulatory Visit | Attending: Obstetrics and Gynecology | Admitting: Obstetrics and Gynecology

## 2020-07-01 DIAGNOSIS — N6489 Other specified disorders of breast: Secondary | ICD-10-CM | POA: Diagnosis not present

## 2020-07-01 DIAGNOSIS — Z803 Family history of malignant neoplasm of breast: Secondary | ICD-10-CM | POA: Diagnosis not present

## 2020-07-01 DIAGNOSIS — Z9189 Other specified personal risk factors, not elsewhere classified: Secondary | ICD-10-CM

## 2020-07-01 MED ORDER — GADOBUTROL 1 MMOL/ML IV SOLN
6.0000 mL | Freq: Once | INTRAVENOUS | Status: AC | PRN
  Administered 2020-07-01: 6 mL via INTRAVENOUS

## 2020-07-06 IMAGING — MG DIGITAL DIAGNOSTIC BILAT W/ TOMO W/ CAD
8 of 17 series · 8 of 40 positions shown · non-contrast
Comparison: Previous exam(s).

CLINICAL DATA: Patient presents for evaluation of diffuse bilateral
breast tenderness.

EXAM:
DIGITAL DIAGNOSTIC BILATERAL MAMMOGRAM WITH CAD AND TOMO
ULTRASOUND LEFT BREAST

[R ML (1 of 2)]
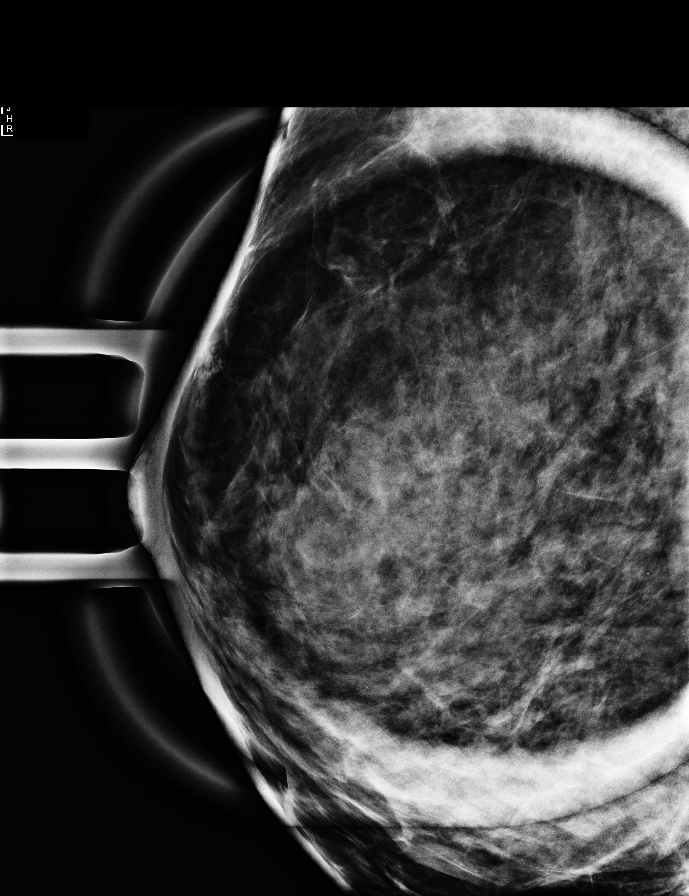

[R ML (2 of 2)]
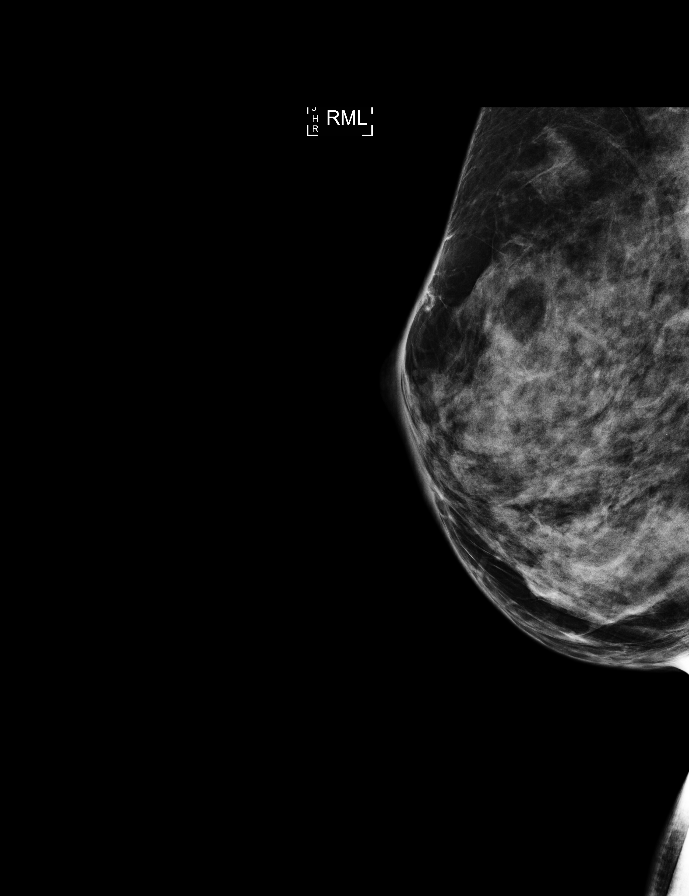

[L MLO synth-2D (1 of 2)]
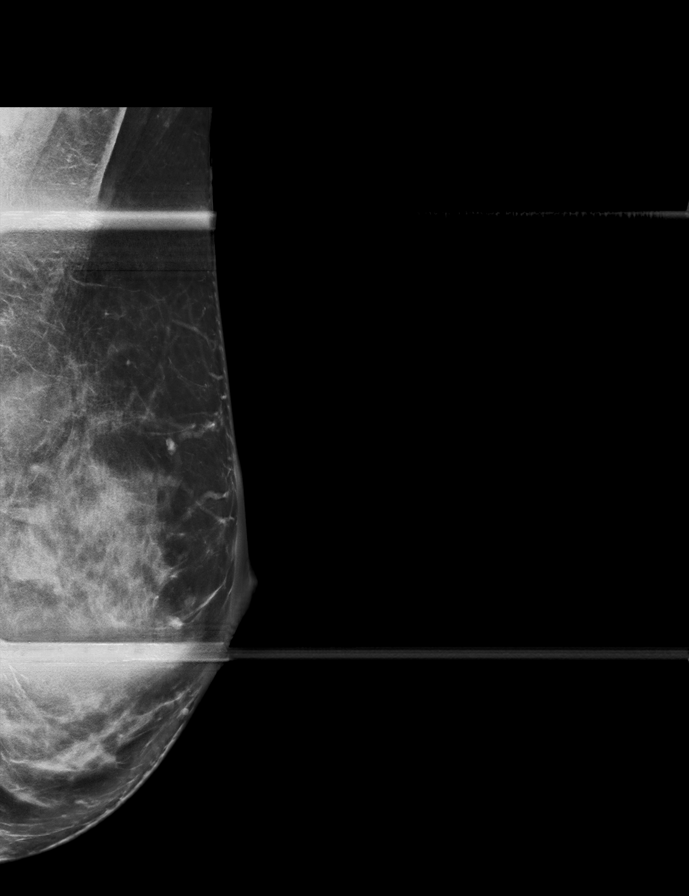

[L CC synth-2D]
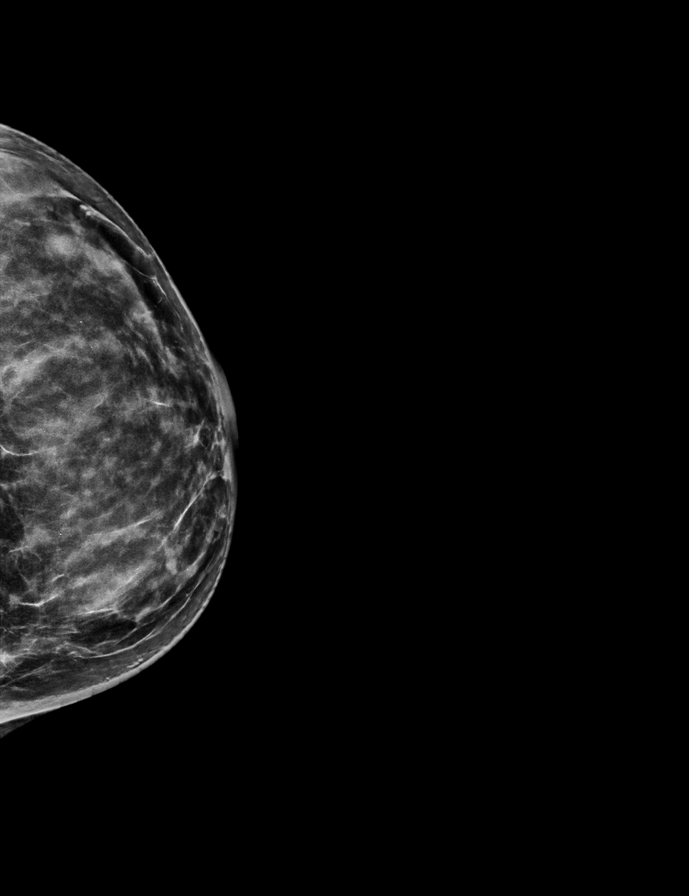

[R CC synth-2D]
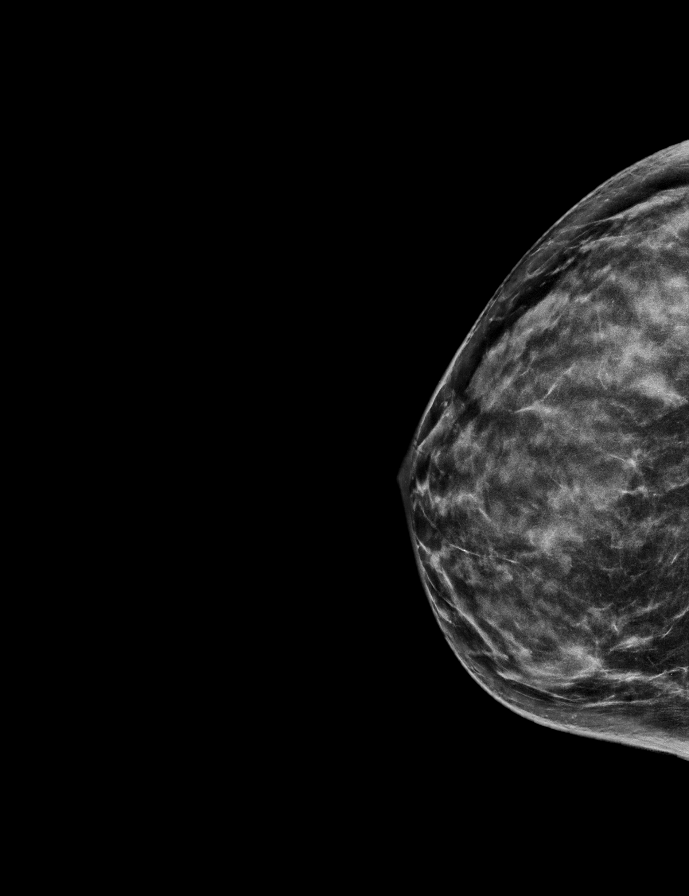

[L ML synth-2D]
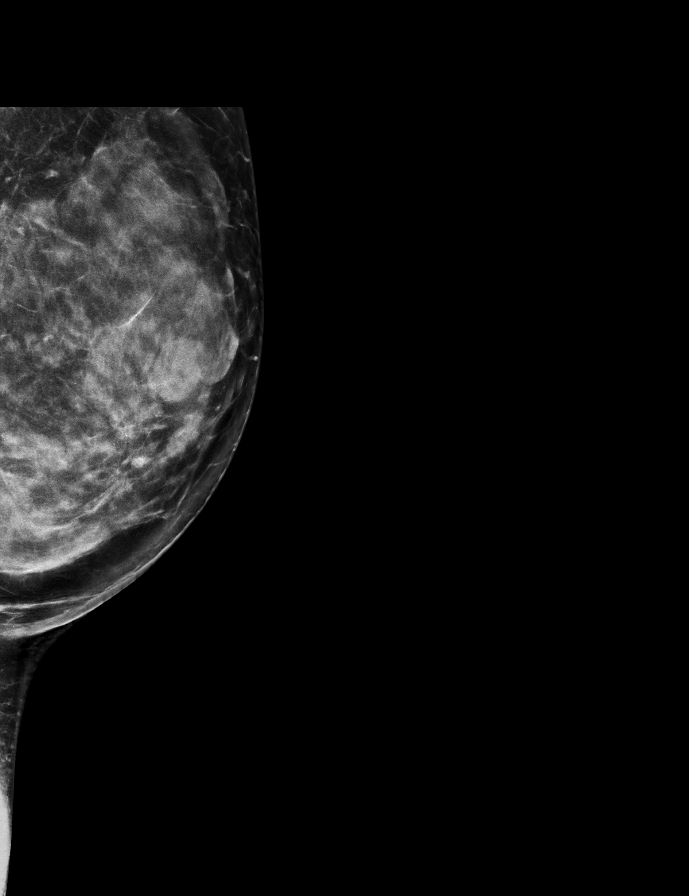

[L MLO synth-2D (2 of 2)]
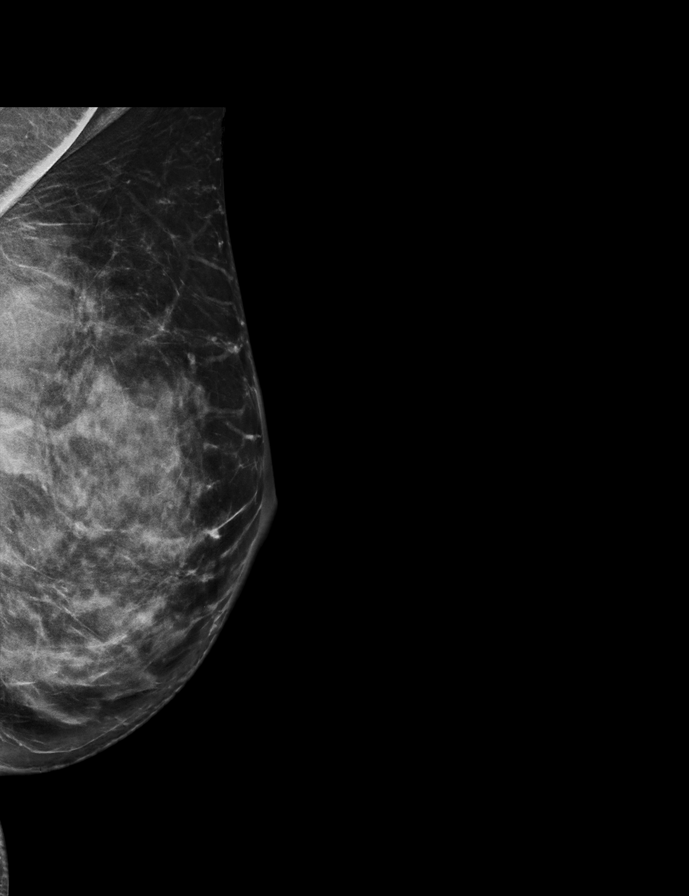

[R MLO synth-2D]
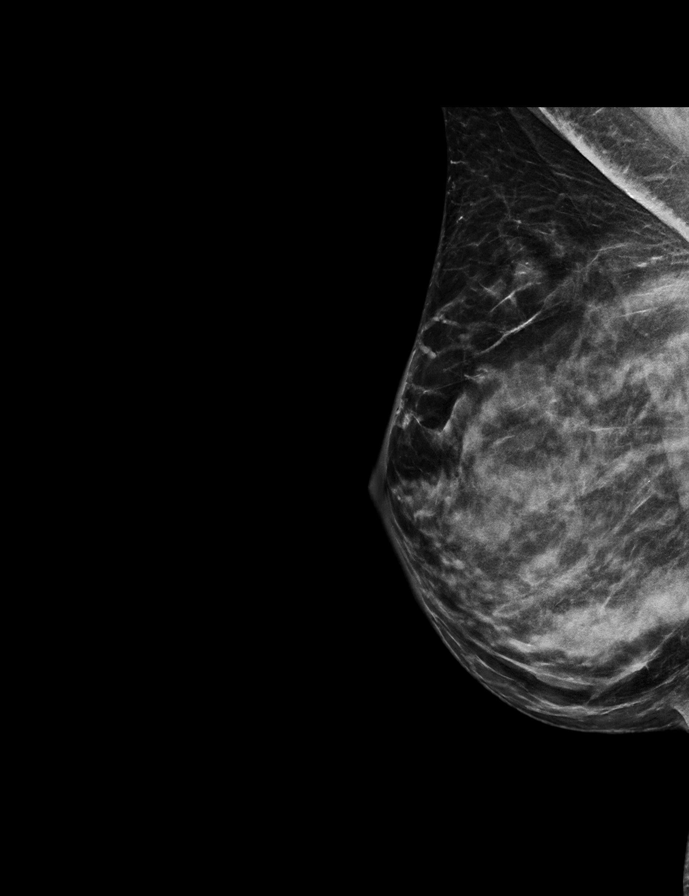

[8 of 40 positions shown; findings below may reference images not displayed]

ACR Breast Density Category c: The breast tissue is heterogeneously
dense, which may obscure small masses.
FINDINGS: There are at least 3 obscured masses demonstrated within the left
breast middle depth further evaluated with spot compression MLO
tomosynthesis images and full paddle true lateral tomosynthesis
images. No additional masses, calcifications or distortion
identified within either breast.

Mammographic images were processed with CAD.

Targeted ultrasound is performed, showing three adjacent cysts
within the left breast. There is a 2.0 x 2.3 x 1.5 cm cyst left
breast 2 o'clock position 5 cm from nipple. There is a 1.4 x 1.2 x
0.8 cm cyst left breast 2 o'clock position 5 cm from nipple. There
is a 1.9 x 1.4 x 1.8 cm simple cyst left breast 3 o'clock position 6
cm from nipple.
IMPRESSION: Multiple cysts left breast.

Otherwise no suspicious findings within the left or right breast to
explain diffuse bilateral breast tenderness.

No mammographic evidence for malignancy.

RECOMMENDATION:
Continued clinical evaluation for bilateral breast tenderness.

Screening mammogram in one year.(Code:O5-Z-OFJ)

I have discussed the findings and recommendations with the patient.
If applicable, a reminder letter will be sent to the patient
regarding the next appointment.

BI-RADS CATEGORY  2: Benign.

## 2020-08-18 ENCOUNTER — Telehealth: Payer: Self-pay | Admitting: *Deleted

## 2020-08-18 DIAGNOSIS — K824 Cholesterolosis of gallbladder: Secondary | ICD-10-CM

## 2020-08-18 NOTE — Telephone Encounter (Signed)
Patient needs 1 year f/u US for gallbladder polyp   You have been scheduled for an abdominal ultrasound at [facility] (1st floor of hospital) on 9/29 at 10:30am. Please arrive 15 minutes prior to your appointment for registration. Make certain not to have anything to eat or drink after midnight prior to your appointment. Should you need to reschedule your appointment, please contact radiology at [phone]. This test typically takes about 30 minutes to perform.  Left message for pt date and time of ultrasound

## 2020-08-25 ENCOUNTER — Ambulatory Visit (HOSPITAL_COMMUNITY)
Admission: RE | Admit: 2020-08-25 | Discharge: 2020-08-25 | Disposition: A | Discharge status: Home / Self Care | Payer: BLUE CROSS/BLUE SHIELD | Source: Ambulatory Visit | Attending: Gastroenterology | Admitting: Gastroenterology

## 2020-08-25 ENCOUNTER — Other Ambulatory Visit: Payer: Self-pay

## 2020-08-25 DIAGNOSIS — K824 Cholesterolosis of gallbladder: Secondary | ICD-10-CM | POA: Insufficient documentation

## 2020-09-02 NOTE — Progress Notes (Signed)
Subjective:    Patient ID: Lauren Reynolds, female    DOB: Jul 06, 1976, 44 y.o.   MRN: [id number]  HPI The patient is here for an acute visit for left-sided abdominal pain.   Left sided abdominal pain:  She states left sided abd pain.  She has had this pain in the past.  She saw GI and they have not yet figured out what the cause was.  She did try IBgard and that did not help.  She tried a few other things and they did not seem to help either.  She did do some intermittent fasting and her pain went away.  At 1 point she was eating more frequently and also did not have pain.  She has not noticed any obvious cause.   About 8 weeks ago -she went mountain biking and exercise at a more intense level than she is used to.  She also had a very active weekend.  After that she had significant leg pain and was very achy.  It was bad enough that she needed to take Advil.  The next morning she was very nauseous, but cannot make herself vomit.  That is when the left lower abdominal pain started again.  Pain was more intense for 2-3 days, but she has had persistent pain in that area since then.  The last couple of days it has been worse.    There has been increased stress and she thought maybe that is what it was related to, which is why she can come in sooner.  She drinks plenty of water, but increased her water intake.  She is increased her fiber.  The pain is persisted and she is not sure why.  She denies any changes in her bowels-no diarrhea or constipation.  There is no blood in her stool.  She denies any fevers.    She just had an abdominal ultrasound, which did not show any possible causes for her pain and was essentially normal.  She has had a couple of bad reactions to bee stings bee sting -- large reaction,  Stayed for weeks.  Yellow jacket - in same spot.  Still has discoloration after 10 days - did same thing but worse.  Reaction reached to almost knee.  She was unsure if that was normal or  not.   Medications and allergies reviewed with patient and updated if appropriate.  Patient Active Problem List   Diagnosis Date Noted  . Left lower quadrant abdominal pain 09/03/2020  . Allergic rhinitis 12/17/2019  . Hyperlipidemia 12/17/2019  . History of nephrolithiasis 12/17/2019  . Gallbladder polyp 12/17/2019  . Rosacea 12/17/2019    Current Outpatient Medications on File Prior to Visit  Medication Sig Dispense Refill  . Cholecalciferol (VITAMIN D) 2000 units CAPS Take 1 capsule by mouth daily.    . fexofenadine (ALLEGRA) 180 MG tablet Take 180 mg by mouth daily.     No current facility-administered medications on file prior to visit.    Past Medical History:  Diagnosis Date  . History of kidney stones     Past Surgical History:  Procedure Laterality Date  . ABLATION    . AUGMENTATION MAMMAPLASTY Bilateral 2016   Implants removed 2016  . BREAST ENHANCEMENT SURGERY    . BREAST IMPLANT REMOVAL    . CESAREAN SECTION     x2    Social History   Socioeconomic History  . Marital status: Married    Spouse name: Not on file  .  Number of children: 2  . Years of education: Not on file  . Highest education level: Not on file  Occupational History  . Not on file  Tobacco Use  . Smoking status: Former Games developer  . Smokeless tobacco: Never Used  . Tobacco comment: high school  Substance and Sexual Activity  . Alcohol use: Yes    Comment: 1 drink most nights  . Drug use: Never  . Sexual activity: Yes  Other Topics Concern  . Not on file  Social History Narrative  . Not on file   Social Determinants of Health   Financial Resource Strain:   . Difficulty of Paying Living Expenses: Not on file  Food Insecurity:   . Worried About Programme researcher, broadcasting/film/video in the Last Year: Not on file  . Ran Out of Food in the Last Year: Not on file  Transportation Needs:   . Lack of Transportation (Medical): Not on file  . Lack of Transportation (Non-Medical): Not on file  Physical  Activity:   . Days of Exercise per Week: Not on file  . Minutes of Exercise per Session: Not on file  Stress:   . Feeling of Stress : Not on file  Social Connections:   . Frequency of Communication with Friends and Family: Not on file  . Frequency of Social Gatherings with Friends and Family: Not on file  . Attends Religious Services: Not on file  . Active Member of Clubs or Organizations: Not on file  . Attends Banker Meetings: Not on file  . Marital Status: Not on file    Family History  Problem Relation Age of Onset  . Breast cancer Mother 82       triple neg  . Hyperlipidemia Mother   . Breast cancer Maternal Aunt   . Esophageal cancer Maternal Uncle   . Breast cancer Paternal Aunt   . Breast cancer Paternal Grandmother   . Thyroid nodules Sister     Review of Systems  Constitutional: Negative for appetite change, chills and fever.  Gastrointestinal: Positive for abdominal pain (LLQ). Negative for blood in stool (no black stool), constipation, diarrhea and nausea.       Increased gas  Genitourinary: Negative for dysuria, frequency and hematuria.       Objective:   Vitals:   09/03/20 0946  BP: 122/78  Pulse: 98  Temp: 98.2 F (36.8 C)  SpO2: 99%   BP Readings from Last 3 Encounters:  09/03/20 122/78  12/17/19 134/82  05/16/18 120/66   Wt Readings from Last 3 Encounters:  09/03/20 134 lb (60.8 kg)  12/17/19 133 lb (60.3 kg)  05/16/18 136 lb (61.7 kg)   Body mass index is 24.51 kg/m.   Physical Exam Constitutional:      Appearance: Normal appearance.  HENT:     Head: Normocephalic and atraumatic.  Abdominal:     General: There is no distension.     Palpations: Abdomen is soft. There is no mass.     Tenderness: There is no abdominal tenderness. There is no guarding or rebound.     Hernia: No hernia is present.  Musculoskeletal:     Right lower leg: No edema.     Left lower leg: No edema.  Skin:    General: Skin is warm and dry.   Neurological:     Mental Status: She is alert.        Reviewed Dr. Mayra Neer last office note.   Lab Results  Component  Value Date   WBC 5.2 09/03/2020   HGB 13.2 09/03/2020   HCT 38.7 09/03/2020   PLT 249.0 09/03/2020   GLUCOSE 96 09/03/2020   CHOL 244 (H) 12/18/2019   TRIG 53.0 12/18/2019   HDL 79.30 12/18/2019   LDLCALC 154 (H) 12/18/2019   ALT 13 09/03/2020   AST 17 09/03/2020   NA 139 09/03/2020   K 4.4 09/03/2020   CL 104 09/03/2020   CREATININE 0.75 09/03/2020   BUN 10 09/03/2020   CO2 29 09/03/2020   TSH 3.01 12/18/2019   US Abdomen Complete CLINICAL DATA:  Follow-up gallbladder polyp  EXAM: ABDOMEN ULTRASOUND COMPLETE  COMPARISON:  06/03/2019, 04/10/2018  FINDINGS: Gallbladder: No shadowing stone. Stable 5 mm gallbladder polyp. Negative for wall thickening or Murphy.  Common bile duct: Diameter: 4 mm  Liver: No focal lesion identified. Within normal limits in parenchymal echogenicity. Portal vein is patent on color Doppler imaging with normal direction of blood flow towards the liver.  IVC: No abnormality visualized.  Pancreas: Visualized portion unremarkable.  Spleen: Size and appearance within normal limits.  Right Kidney: Length: 10.5 cm. Echogenicity within normal limits. No mass or hydronephrosis visualized.  Left Kidney: Length: 10.3 cm. Echogenicity within normal limits. No mass or hydronephrosis visualized.  Abdominal aorta: No aneurysm visualized.  Other findings: None.  IMPRESSION: 1. Stable 5 mm gallbladder polyp. 2. Otherwise negative abdominal ultrasound  Electronically Signed   By: Jasmine Pang M.D.   On: 08/25/2020 15:58     Assessment & Plan:    See Problem List for Assessment and Plan of chronic medical problems.    This visit occurred during the SARS-CoV-2 public health emergency.  Safety protocols were in place, including screening questions prior to the visit, additional usage of staff PPE, and extensive  cleaning of exam room while observing appropriate contact time as indicated for disinfecting solutions.

## 2020-09-03 ENCOUNTER — Ambulatory Visit (INDEPENDENT_AMBULATORY_CARE_PROVIDER_SITE_OTHER): Payer: BLUE CROSS/BLUE SHIELD | Admitting: Internal Medicine

## 2020-09-03 ENCOUNTER — Ambulatory Visit (INDEPENDENT_AMBULATORY_CARE_PROVIDER_SITE_OTHER)
Admission: RE | Admit: 2020-09-03 | Discharge: 2020-09-03 | Disposition: A | Discharge status: Home / Self Care | Payer: BLUE CROSS/BLUE SHIELD | Source: Ambulatory Visit | Attending: Internal Medicine | Admitting: Internal Medicine

## 2020-09-03 ENCOUNTER — Other Ambulatory Visit: Payer: Self-pay

## 2020-09-03 ENCOUNTER — Encounter: Payer: Self-pay | Admitting: Internal Medicine

## 2020-09-03 ENCOUNTER — Telehealth: Payer: Self-pay | Admitting: Internal Medicine

## 2020-09-03 VITALS — BP 122/78 | HR 98 | Temp 98.2°F | Ht 62.0 in | Wt 134.0 lb

## 2020-09-03 DIAGNOSIS — R1012 Left upper quadrant pain: Secondary | ICD-10-CM | POA: Insufficient documentation

## 2020-09-03 DIAGNOSIS — Z23 Encounter for immunization: Secondary | ICD-10-CM | POA: Diagnosis not present

## 2020-09-03 DIAGNOSIS — R1032 Left lower quadrant pain: Secondary | ICD-10-CM

## 2020-09-03 DIAGNOSIS — T63444A Toxic effect of venom of bees, undetermined, initial encounter: Secondary | ICD-10-CM

## 2020-09-03 DIAGNOSIS — T63441A Toxic effect of venom of bees, accidental (unintentional), initial encounter: Secondary | ICD-10-CM | POA: Insufficient documentation

## 2020-09-03 DIAGNOSIS — K6389 Other specified diseases of intestine: Secondary | ICD-10-CM | POA: Diagnosis not present

## 2020-09-03 DIAGNOSIS — K3189 Other diseases of stomach and duodenum: Secondary | ICD-10-CM | POA: Diagnosis not present

## 2020-09-03 DIAGNOSIS — K429 Umbilical hernia without obstruction or gangrene: Secondary | ICD-10-CM | POA: Diagnosis not present

## 2020-09-03 DIAGNOSIS — N200 Calculus of kidney: Secondary | ICD-10-CM | POA: Diagnosis not present

## 2020-09-03 DIAGNOSIS — G8929 Other chronic pain: Secondary | ICD-10-CM | POA: Insufficient documentation

## 2020-09-03 LAB — CBC WITH DIFFERENTIAL/PLATELET
Basophils Absolute: 0 10*3/uL (ref 0.0–0.1)
Basophils Relative: 0.4 % (ref 0.0–3.0)
Eosinophils Absolute: 0.1 10*3/uL (ref 0.0–0.7)
Eosinophils Relative: 1.5 % (ref 0.0–5.0)
HCT: 38.7 % (ref 36.0–46.0)
Hemoglobin: 13.2 g/dL (ref 12.0–15.0)
Lymphocytes Relative: 27.1 % (ref 12.0–46.0)
Lymphs Abs: 1.4 10*3/uL (ref 0.7–4.0)
MCHC: 34.1 g/dL (ref 30.0–36.0)
MCV: 88.7 fl (ref 78.0–100.0)
Monocytes Absolute: 0.4 10*3/uL (ref 0.1–1.0)
Monocytes Relative: 7.1 % (ref 3.0–12.0)
Neutro Abs: 3.3 10*3/uL (ref 1.4–7.7)
Neutrophils Relative %: 63.9 % (ref 43.0–77.0)
Platelets: 249 10*3/uL (ref 150.0–400.0)
RBC: 4.36 Mil/uL (ref 3.87–5.11)
RDW: 13.3 % (ref 11.5–15.5)
WBC: 5.2 10*3/uL (ref 4.0–10.5)

## 2020-09-03 LAB — COMPREHENSIVE METABOLIC PANEL
ALT: 13 U/L (ref 0–35)
AST: 17 U/L (ref 0–37)
Albumin: 4.7 g/dL (ref 3.5–5.2)
Alkaline Phosphatase: 39 U/L (ref 39–117)
BUN: 10 mg/dL (ref 6–23)
CO2: 29 mEq/L (ref 19–32)
Calcium: 9.7 mg/dL (ref 8.4–10.5)
Chloride: 104 mEq/L (ref 96–112)
Creatinine, Ser: 0.75 mg/dL (ref 0.40–1.20)
GFR: 95.28 mL/min (ref >60.00)
Glucose, Bld: 96 mg/dL (ref 70–99)
Potassium: 4.4 mEq/L (ref 3.5–5.1)
Sodium: 139 mEq/L (ref 135–145)
Total Bilirubin: 0.6 mg/dL (ref 0.2–1.2)
Total Protein: 7.9 g/dL (ref 6.0–8.3)

## 2020-09-03 LAB — LIPASE: Lipase: 15 U/L (ref 11.0–59.0)

## 2020-09-03 LAB — AMYLASE: Amylase: 19 U/L — ABNORMAL LOW (ref 27–131)

## 2020-09-03 MED ORDER — AMOXICILLIN-POT CLAVULANATE 875-125 MG PO TABS: 1.0000 | ORAL_TABLET | Freq: Two times a day (BID) | ORAL | 0 refills | Status: DC

## 2020-09-03 MED ORDER — IOHEXOL 300 MG/ML  SOLN
100.0000 mL | Freq: Once | INTRAMUSCULAR | Status: AC | PRN
  Administered 2020-09-03: 100 mL via INTRAVENOUS

## 2020-09-03 MED ORDER — AMOXICILLIN-POT CLAVULANATE 875-125 MG PO TABS
1.0000 | ORAL_TABLET | Freq: Two times a day (BID) | ORAL | 0 refills | Status: DC
Start: 2020-09-03 — End: 2020-09-03

## 2020-09-03 NOTE — Assessment & Plan Note (Signed)
Recent bee sting reaction x2 that was more severe than she is used to Discussed that this can be a normal occurrence and medications that typically help No symptoms suggestive of any type of anaphylactic reaction

## 2020-09-03 NOTE — Telephone Encounter (Signed)
Please call her-let her know the CT scan shows that she has a portion of her small bowel that is inflamed.  This is called enteritis.  I do want to go ahead and have her take an antibiotic to see if that helps.  Her blood work is all normal.  She should follow-up with Dr. Lavon Paganini, but this is not urgent.  There is no evidence of diverticulosis or diverticulitis.  I like her to take Augmentin twice daily for 10 days.  I sent this to [facility] in [place] that is not the correct [facility] please resend.

## 2020-09-03 NOTE — Patient Instructions (Signed)
  Blood work was ordered.  Have this done downstairs.     Medications reviewed and updated.  Changes include :   none    A Ct scan of your abdomen was ordered.

## 2020-09-03 NOTE — Addendum Note (Signed)
Addended by: Karma Ganja on: 09/03/2020 01:08 PM   Modules accepted: Orders

## 2020-09-03 NOTE — Assessment & Plan Note (Signed)
Acute on chronic Has had some intermittent left-sided abdominal pain, but for the past 8 weeks has had persistent pain there, which has been worse in the past 2 days or so. Has seen GI in the past and work-up was in progress-there was a question about possibility of diverticulitis/osis, but this was not able to be evaluated prior to the pandemic starting Given the persistence of the pain further evaluation is needed.  We will get a CT of the abdomen and pelvis to rule out diverticulitis, but I would.  Surprised if she had something that lasted that long We will check CBC, CMP, lipase and amylase Will need to follow-up with GI no matter what the CT shows because eventually she should have a colonoscopy given her age and definitely considering her symptoms

## 2020-09-15 ENCOUNTER — Encounter: Payer: Self-pay | Admitting: Internal Medicine

## 2020-11-17 ENCOUNTER — Ambulatory Visit (INDEPENDENT_AMBULATORY_CARE_PROVIDER_SITE_OTHER): Payer: BLUE CROSS/BLUE SHIELD | Admitting: Gastroenterology

## 2020-11-17 ENCOUNTER — Encounter: Payer: Self-pay | Admitting: Gastroenterology

## 2020-11-17 VITALS — BP 132/72 | HR 76 | Ht 62.0 in | Wt 136.0 lb

## 2020-11-17 DIAGNOSIS — Z1211 Encounter for screening for malignant neoplasm of colon: Secondary | ICD-10-CM

## 2020-11-17 DIAGNOSIS — K581 Irritable bowel syndrome with constipation: Secondary | ICD-10-CM

## 2020-11-17 DIAGNOSIS — R1032 Left lower quadrant pain: Secondary | ICD-10-CM

## 2020-11-17 MED ORDER — SUPREP BOWEL PREP KIT 17.5-3.13-1.6 GM/177ML PO SOLN
1.0000 | ORAL | 0 refills | Status: DC
Start: 2020-11-17 — End: 2020-12-27

## 2020-11-17 MED ORDER — DICYCLOMINE HCL 20 MG PO TABS: 20.0000 mg | ORAL_TABLET | Freq: Three times a day (TID) | ORAL | 6 refills | Status: DC | PRN

## 2020-11-17 NOTE — Progress Notes (Signed)
EARNESTEEN BIRNIE    [id number]    21-Jul-1976  Primary Care Physician:Burns, Bobette Mo, MD  Referring Physician: Pincus Sanes, MD [address] [place],  [place] [address]   Chief complaint:  Abdominal pain  HPI:  44 year old very pleasant female here for follow-up visit for abdominal pain  Left side abdominal pain, since October.  Initially was constant severe pain on the left side, she could point to a specific spot.  Over the last few weeks it is improving and is intermittent.  Some improvement with probiotics. She was treated with course of antibiotics by Dr. Lawerance Bach for long segment segment thickening of small bowel, she did not notice any significant improvement after antibiotics.  She is using IBgard and Tylenol as needed  Denies any straining with exercise or lifting heavy weights.  No change in dietary habits or bowel habits.  No weight loss.  No blood in stool.  CT abdomen and pelvis with contrast September 03, 2020: Mild long segment thickening of small bowel loops, likely overread by radiologist could be secondary to small bowel peristalsis/contraction as there was no associated stranding or free fluid. Bilateral nonobstructing nephrolithiasis L>>R  Abdominal ultrasound August 25, 2020: Stable 5 mm gallbladder polyp, no gallstones or gallbladder wall thickening.  Abdominal ultrasound Apr 10, 2018 showed 6 mm gallbladder polyp and nonobstructive 5 mm left renal calculus otherwise unremarkable exam. Abdominal x-ray and chest x-ray showed nonobstructive left renal calculi and mildly prominent stool in the proximal colon  Repeat abdominal ultrasound July 2020 1. The gallbladder polyp again measures 5.8 mm, not significantly changed. By protocol, no imaging follow-up is recommended. 2. No other significant abnormalities.   Outpatient Encounter Medications as of 11/17/2020  Medication Sig  . amoxicillin-clavulanate (AUGMENTIN) 875-125 MG tablet Take 1  tablet by mouth 2 (two) times daily.  . Cholecalciferol (VITAMIN D) 2000 units CAPS Take 1 capsule by mouth daily.  . fexofenadine (ALLEGRA) 180 MG tablet Take 180 mg by mouth daily.   No facility-administered encounter medications on file as of 11/17/2020.    Allergies as of 11/17/2020 - Review Complete 09/03/2020  Allergen Reaction Noted  . Neosporin plus max st Rash 01/16/2013  . Benzalkonium chloride Rash 07/30/2015  . Codeine Nausea Only 01/16/2013    Past Medical History:  Diagnosis Date  . History of kidney stones     Past Surgical History:  Procedure Laterality Date  . ABLATION    . AUGMENTATION MAMMAPLASTY Bilateral 2016   Implants removed 2016  . BREAST ENHANCEMENT SURGERY    . BREAST IMPLANT REMOVAL    . CESAREAN SECTION     x2    Family History  Problem Relation Age of Onset  . Breast cancer Mother 35       triple neg  . Hyperlipidemia Mother   . Breast cancer Maternal Aunt   . Esophageal cancer Maternal Uncle   . Breast cancer Paternal Aunt   . Breast cancer Paternal Grandmother   . Thyroid nodules Sister     Social History   Socioeconomic History  . Marital status: Married    Spouse name: Not on file  . Number of children: 2  . Years of education: Not on file  . Highest education level: Not on file  Occupational History  . Not on file  Tobacco Use  . Smoking status: Former Games developer  . Smokeless tobacco: Never Used  . Tobacco comment: high school  Substance  and Sexual Activity  . Alcohol use: Yes    Comment: 1 drink most nights  . Drug use: Never  . Sexual activity: Yes  Other Topics Concern  . Not on file  Social History Narrative  . Not on file   Social Determinants of Health   Financial Resource Strain: Not on file  Food Insecurity: Not on file  Transportation Needs: Not on file  Physical Activity: Not on file  Stress: Not on file  Social Connections: Not on file  Intimate Partner Violence: Not on file      Review of  systems: All other review of systems negative except as mentioned in the HPI.   Physical Exam: Vitals:   11/17/20 1127  BP: 132/72  Pulse: 76  SpO2: 98%   Body mass index is 24.87 kg/m. Gen:      No acute distress HEENT:  sclera anicteric Abd:      soft, non-tender; no palpable masses, no distension Ext:    No edema Neuro: alert and oriented x 3 Psych: normal mood and affect  Data Reviewed:  Reviewed labs, radiology imaging, old records and pertinent past GI work up   Assessment and Plan/Recommendations:  44 year old very pleasant female with complaints of left lower quadrant abdominal pain  Likely etiology of abdominal pain is nephrolithiasis, multiple stones noted in the left kidney on recent CT abdomen and pelvis and there was solitary 2 mm stone in the right kidney  Isolated long segment mild thickening of small bowel likely was over read on CT scan as there was no associated inflammatory stranding or significant amount of free fluid.  Plan to schedule colonoscopy for colorectal cancer screening, exclude diverticulosis.  Recent CT was negative for diverticulitis  IBS and abdominal cramping: Use dicyclomine 20 mg every 8 hours as needed Continue IBgard 1 capsule up to 3 times daily as needed  Gallbladder polyp: 5 to 6 mm in size, stable in the past 3 years.  No follow-up recommended.  If colonoscopy unrevealing for any significant pathology and continues to have persistent abdominal pain we will plan to repeat CT kidney stone protocol to further evaluate and will consider referral to urology if has any signs of hydronephrosis or obstruction  The risks and benefits as well as alternatives of endoscopic procedure(s) have been discussed and reviewed. All questions answered. The patient agrees to proceed.   This visit required >40 minutes of patient care (this includes precharting, chart review, review of results, face-to-face time used for counseling as well as treatment  plan and follow-up. The patient was provided an opportunity to ask questions and all were answered. The patient agreed with the plan and demonstrated an understanding of the instructions.  Iona Beard , MD    CC: Pincus Sanes, MD

## 2020-11-17 NOTE — Patient Instructions (Addendum)
If you are age 46 or younger, your body mass index should be between 19-25. Your Body mass index is 24.87 kg/m. If this is out of the aformentioned range listed, please consider follow up with your Primary Care Provider.   We have sent the following medications to your pharmacy for you to pick up at your convenience: Suprep, Dicyclomine   You have been scheduled for a colonoscopy. Please follow written instructions given to you at your visit today.  Please pick up your prep supplies at the pharmacy within the next 1-3 days. If you use inhalers (even only as needed), please bring them with you on the day of your procedure.   Thank you for choosing me and [facility].  Dr.Nandigam

## 2020-11-23 ENCOUNTER — Encounter: Payer: Self-pay | Admitting: Internal Medicine

## 2020-11-23 DIAGNOSIS — Z20828 Contact with and (suspected) exposure to other viral communicable diseases: Secondary | ICD-10-CM | POA: Diagnosis not present

## 2020-12-01 ENCOUNTER — Encounter: Payer: Self-pay | Admitting: Internal Medicine

## 2020-12-01 ENCOUNTER — Other Ambulatory Visit: Payer: Self-pay

## 2020-12-01 ENCOUNTER — Telehealth (INDEPENDENT_AMBULATORY_CARE_PROVIDER_SITE_OTHER): Payer: BLUE CROSS/BLUE SHIELD | Admitting: Internal Medicine

## 2020-12-01 DIAGNOSIS — J329 Chronic sinusitis, unspecified: Secondary | ICD-10-CM | POA: Insufficient documentation

## 2020-12-01 DIAGNOSIS — J0110 Acute frontal sinusitis, unspecified: Secondary | ICD-10-CM

## 2020-12-01 MED ORDER — AMOXICILLIN-POT CLAVULANATE 875-125 MG PO TABS
1.0000 | ORAL_TABLET | Freq: Two times a day (BID) | ORAL | 0 refills | Status: AC
Start: 2020-12-01 — End: 2020-12-08

## 2020-12-01 NOTE — Progress Notes (Signed)
Virtual Visit via Video Note  I connected with Lauren Reynolds on 12/01/20 at 10:00 AM EST by a video enabled telemedicine application and verified that I am speaking with the correct person using two identifiers.  The patient and the provider were at separate locations throughout the entire encounter. Patient location: home, Provider location: work   I discussed the limitations of evaluation and management by telemedicine and the availability of in person appointments. The patient expressed understanding and agreed to proceed. The patient and the provider were the only parties present for the visit unless noted in HPI below.  History of Present Illness: The patient is a 45 y.o. female with visit for sinus congestion. Started 2 weeks ago with headaches and mild headaches. Has no fevers or chills. Did start taking allegra which did not help tons. Last weekend chills and fevers about 1 week in. Did have negative covid-19 test last week times 2 both rapid and PCR negative. Denies sick contacts. Denies SOB or cough. Did take advil for the headache which did help. Some ear pain also. Overall it is not improving. 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 Did have booster shot and 2 shots for covid-19.   Observations/Objective: Appearance: normal, breathing appears normal, casual grooming, abdomen does not appear distended, throat with drainage, pain to self palpation right frontal and temporal region, mental status is A and O times 3  Assessment and Plan: See problem oriented charting  Follow Up Instructions: rx augmentin  I discussed the assessment and treatment plan with the patient. The patient was provided an opportunity to ask questions and all were answered. The patient agreed with the plan and demonstrated an understanding of the instructions.   The patient was advised to call back or seek an in-person evaluation if the symptoms worsen or if the condition fails to improve as anticipated.  Myrlene Broker,  MD

## 2020-12-01 NOTE — Assessment & Plan Note (Signed)
Rx augmentin.  

## 2020-12-17 ENCOUNTER — Encounter: Payer: BLUE CROSS/BLUE SHIELD | Admitting: Internal Medicine

## 2020-12-21 ENCOUNTER — Encounter: Payer: BLUE CROSS/BLUE SHIELD | Admitting: Gastroenterology

## 2020-12-24 ENCOUNTER — Other Ambulatory Visit: Payer: Self-pay

## 2020-12-26 ENCOUNTER — Encounter: Payer: Self-pay | Admitting: Internal Medicine

## 2020-12-26 NOTE — Patient Instructions (Addendum)
Blood work was ordered.     No immunization administered today.    Medications changes include :   none    Please followup in 1 year    Health Maintenance, Female Adopting a healthy lifestyle and getting preventive care are important in promoting health and wellness. Ask your health care provider about:  The right schedule for you to have regular tests and exams.  Things you can do on your own to prevent diseases and keep yourself healthy. What should I know about diet, weight, and exercise? Eat a healthy diet  Eat a diet that includes plenty of vegetables, fruits, low-fat dairy products, and lean protein.  Do not eat a lot of foods that are high in solid fats, added sugars, or sodium.   Maintain a healthy weight Body mass index (BMI) is used to identify weight problems. It estimates body fat based on height and weight. Your health care provider can help determine your BMI and help you achieve or maintain a healthy weight. Get regular exercise Get regular exercise. This is one of the most important things you can do for your health. Most adults should:  Exercise for at least 150 minutes each week. The exercise should increase your heart rate and make you sweat (moderate-intensity exercise).  Do strengthening exercises at least twice a week. This is in addition to the moderate-intensity exercise.  Spend less time sitting. Even light physical activity can be beneficial. Watch cholesterol and blood lipids Have your blood tested for lipids and cholesterol at 47 years of age, then have this test every 5 years. Have your cholesterol levels checked more often if:  Your lipid or cholesterol levels are high.  You are older than 47 years of age.  You are at high risk for heart disease. What should I know about cancer screening? Depending on your health history and family history, you may need to have cancer screening at various ages. This may include screening for:  Breast  cancer.  Cervical cancer.  Colorectal cancer.  Skin cancer.  Lung cancer. What should I know about heart disease, diabetes, and high blood pressure? Blood pressure and heart disease  High blood pressure causes heart disease and increases the risk of stroke. This is more likely to develop in people who have high blood pressure readings, are of African descent, or are overweight.  Have your blood pressure checked: ? Every 3-5 years if you are 47-9 years of age. ? Every year if you are 62 years old or older. Diabetes Have regular diabetes screenings. This checks your fasting blood sugar level. Have the screening done:  Once every three years after age 21 if you are at a normal weight and have a low risk for diabetes.  More often and at a younger age if you are overweight or have a high risk for diabetes. What should I know about preventing infection? Hepatitis B If you have a higher risk for hepatitis B, you should be screened for this virus. Talk with your health care provider to find out if you are at risk for hepatitis B infection. Hepatitis C Testing is recommended for:  Everyone born from [birth year] through 1967.  Anyone with known risk factors for hepatitis C. Sexually transmitted infections (STIs)  Get screened for STIs, including gonorrhea and chlamydia, if: ? You are sexually active and are younger than 47 years of age. ? You are older than 46 years of age and your health care provider tells you that you are  at risk for this type of infection. ? Your sexual activity has changed since you were last screened, and you are at increased risk for chlamydia or gonorrhea. Ask your health care provider if you are at risk.  Ask your health care provider about whether you are at high risk for HIV. Your health care provider may recommend a prescription medicine to help prevent HIV infection. If you choose to take medicine to prevent HIV, you should first get tested for HIV. You should  then be tested every 3 months for as long as you are taking the medicine. Pregnancy  If you are about to stop having your period (premenopausal) and you may become pregnant, seek counseling before you get pregnant.  Take 400 to 800 micrograms (mcg) of folic acid every day if you become pregnant.  Ask for birth control (contraception) if you want to prevent pregnancy. Osteoporosis and menopause Osteoporosis is a disease in which the bones lose minerals and strength with aging. This can result in bone fractures. If you are 21 years old or older, or if you are at risk for osteoporosis and fractures, ask your health care provider if you should:  Be screened for bone loss.  Take a calcium or vitamin D supplement to lower your risk of fractures.  Be given hormone replacement therapy (HRT) to treat symptoms of menopause. Follow these instructions at home: Lifestyle  Do not use any products that contain nicotine or tobacco, such as cigarettes, e-cigarettes, and chewing tobacco. If you need help quitting, ask your health care provider.  Do not use street drugs.  Do not share needles.  Ask your health care provider for help if you need support or information about quitting drugs. Alcohol use  Do not drink alcohol if: ? Your health care provider tells you not to drink. ? You are pregnant, may be pregnant, or are planning to become pregnant.  If you drink alcohol: ? Limit how much you use to 0-1 drink a day. ? Limit intake if you are breastfeeding.  Be aware of how much alcohol is in your drink. In the [place] one drink equals one 12 oz bottle of beer (355 mL), one 5 oz glass of wine (148 mL), or one 1 oz glass of hard liquor (44 mL). General instructions  Schedule regular health, dental, and eye exams.  Stay current with your vaccines.  Tell your health care provider if: ? You often feel depressed. ? You have ever been abused or do not feel safe at home. Summary  Adopting a  healthy lifestyle and getting preventive care are important in promoting health and wellness.  Follow your health care provider's instructions about healthy diet, exercising, and getting tested or screened for diseases.  Follow your health care provider's instructions on monitoring your cholesterol and blood pressure. This information is not intended to replace advice given to you by your health care provider. Make sure you discuss any questions you have with your health care provider. Document Revised: 11/06/2018 Document Reviewed: 11/06/2018 Elsevier Patient Education  2021 [employer].

## 2020-12-26 NOTE — Progress Notes (Signed)
Subjective:    Patient ID: Lauren Reynolds, female    DOB: 1976-02-14, 45 y.o.   MRN: [id number]   This visit occurred during the SARS-CoV-2 public health emergency.  Safety protocols were in place, including screening questions prior to the visit, additional usage of staff PPE, and extensive cleaning of exam room while observing appropriate contact time as indicated for disinfecting solutions.    HPI She is here for a physical exam.   Her abdominal pain is better.  She has been having some chest discomfort - worse in the past week or so.  Sometimes it is dull pain.  It is not consistent.    When she lays down at night she feels her heart beating hard and fast.  No pain.  It does not occurred when she is getting ready for bed.  She has some left elbow pain.  It feels muscular.     Medications and allergies reviewed with patient and updated if appropriate.  Patient Active Problem List   Diagnosis Date Noted  . Irritable bowel syndrome with constipation 12/27/2020  . Left lower quadrant abdominal pain 09/03/2020  . Local reaction to bee sting 09/03/2020  . Allergic rhinitis 12/17/2019  . Hyperlipidemia 12/17/2019  . History of nephrolithiasis 12/17/2019  . Gallbladder polyp 12/17/2019  . Rosacea 12/17/2019    Current Outpatient Medications on File Prior to Visit  Medication Sig Dispense Refill  . Cholecalciferol (VITAMIN D) 2000 units CAPS Take 1 capsule by mouth daily.    . fexofenadine (ALLEGRA) 180 MG tablet Take 180 mg by mouth daily.    . metroNIDAZOLE (METROCREAM) 0.75 % cream Apply topically daily.    . Probiotic Product (PROBIOTIC DAILY PO) Take by mouth. JustThrive probiotic take one daily     No current facility-administered medications on file prior to visit.    Past Medical History:  Diagnosis Date  . History of kidney stones     Past Surgical History:  Procedure Laterality Date  . ABLATION    . AUGMENTATION MAMMAPLASTY Bilateral 2016   Implants  removed 2016  . BREAST ENHANCEMENT SURGERY    . BREAST IMPLANT REMOVAL    . CESAREAN SECTION     x2    Social History   Socioeconomic History  . Marital status: Married    Spouse name: Not on file  . Number of children: 2  . Years of education: Not on file  . Highest education level: Not on file  Occupational History  . Not on file  Tobacco Use  . Smoking status: Former Games developer  . Smokeless tobacco: Never Used  . Tobacco comment: high school  Vaping Use  . Vaping Use: Never used  Substance and Sexual Activity  . Alcohol use: Yes    Comment: 1 drink most nights  . Drug use: Never  . Sexual activity: Yes  Other Topics Concern  . Not on file  Social History Narrative  . Not on file   Social Determinants of Health   Financial Resource Strain: Not on file  Food Insecurity: Not on file  Transportation Needs: Not on file  Physical Activity: Not on file  Stress: Not on file  Social Connections: Not on file    Family History  Problem Relation Age of Onset  . Breast cancer Mother 47       triple neg  . Hyperlipidemia Mother   . Breast cancer Maternal Aunt   . Esophageal cancer Maternal Uncle   . Breast cancer  Paternal Aunt   . Breast cancer Paternal Grandmother   . Thyroid nodules Sister     Review of Systems  Constitutional: Negative for chills and fever.  Eyes: Negative for visual disturbance.  Respiratory: Negative for cough, shortness of breath and wheezing.   Cardiovascular: Negative for chest pain (some discomfort at times), palpitations (heart pounding at night when she first lays down) and leg swelling.  Gastrointestinal: Positive for abdominal pain (occ LLQ) and constipation (controlled). Negative for blood in stool, diarrhea and nausea.       Rare  Genitourinary: Negative for dysuria and hematuria.  Musculoskeletal: Negative for arthralgias and back pain.  Skin: Negative for color change and rash.  Neurological: Negative for light-headedness and  headaches.  Psychiatric/Behavioral: Negative for dysphoric mood. The patient is not nervous/anxious.        Objective:   Vitals:   12/27/20 0801  BP: 116/78  Pulse: 85  Temp: 98 F (36.7 C)  SpO2: 98%   Filed Weights   12/27/20 0801  Weight: 138 lb 9.6 oz (62.9 kg)   Body mass index is 25.35 kg/m.  BP Readings from Last 3 Encounters:  12/27/20 116/78  11/17/20 132/72  09/03/20 122/78    Wt Readings from Last 3 Encounters:  12/27/20 138 lb 9.6 oz (62.9 kg)  11/17/20 136 lb (61.7 kg)  09/03/20 134 lb (60.8 kg)    Depression screen [facility] 2/9 12/27/2020 12/17/2019  Decreased Interest 0 0  Down, Depressed, Hopeless 0 0  PHQ - 2 Score 0 0  Altered sleeping 2 -  Tired, decreased energy 1 -  Change in appetite 0 -  Feeling bad or failure about yourself  0 -  Trouble concentrating 0 -  Moving slowly or fidgety/restless 0 -  Suicidal thoughts 0 -  PHQ-9 Score 3 -  Difficult doing work/chores Not difficult at all -      Physical Exam Constitutional: She appears well-developed and well-nourished. No distress.  HENT:  Head: Normocephalic and atraumatic.  Right Ear: External ear normal. Normal ear canal and TM Left Ear: External ear normal.  Normal ear canal and TM Mouth/Throat: Oropharynx is clear and moist.  Eyes: Conjunctivae and EOM are normal.  Neck: Neck supple. No tracheal deviation present. No thyromegaly present.  No carotid bruit  Cardiovascular: Normal rate, regular rhythm and normal heart sounds.   No murmur heard.  No edema. Pulmonary/Chest: Effort normal and breath sounds normal. No respiratory distress. She has no wheezes. She has no rales.  Breast: deferred   Abdominal: Soft. She exhibits no distension. There is no tenderness.  Lymphadenopathy: She has no cervical adenopathy.  Skin: Skin is warm and dry. She is not diaphoretic.  Psychiatric: She has a normal mood and affect. Her behavior is normal.   The 10-year ASCVD risk score Denman George DC Jr., et al.,  2013) is: 0.6%   Values used to calculate the score:     Age: 38 years     Sex: Female     Is Non-Hispanic African American: No     Diabetic: No     Tobacco smoker: No     Systolic Blood Pressure: 116 mmHg     Is BP treated: No     HDL Cholesterol: 79.3 mg/dL     Total Cholesterol: 244 mg/dL      Assessment & Plan:   Physical exam: Screening blood work    ordered Immunizations  All up to date Colonoscopy  Discussed new recommendations Mammogram  Due  - -  will schedule Gyn   Due - will schedule  Exercise   Regular - moderate dail Weight  -normal Substance abuse  none   Screened for depression using the PHQ 9 scale.  No evidence of depression.     See Problem List for Assessment and Plan of chronic medical problems.

## 2020-12-27 ENCOUNTER — Other Ambulatory Visit: Payer: Self-pay

## 2020-12-27 ENCOUNTER — Ambulatory Visit (INDEPENDENT_AMBULATORY_CARE_PROVIDER_SITE_OTHER): Payer: BLUE CROSS/BLUE SHIELD | Admitting: Internal Medicine

## 2020-12-27 VITALS — BP 116/78 | HR 85 | Temp 98.0°F | Ht 62.0 in | Wt 138.6 lb

## 2020-12-27 DIAGNOSIS — Z1159 Encounter for screening for other viral diseases: Secondary | ICD-10-CM

## 2020-12-27 DIAGNOSIS — Z0000 Encounter for general adult medical examination without abnormal findings: Secondary | ICD-10-CM | POA: Diagnosis not present

## 2020-12-27 DIAGNOSIS — E782 Mixed hyperlipidemia: Secondary | ICD-10-CM | POA: Diagnosis not present

## 2020-12-27 DIAGNOSIS — K581 Irritable bowel syndrome with constipation: Secondary | ICD-10-CM | POA: Diagnosis not present

## 2020-12-27 DIAGNOSIS — Z114 Encounter for screening for human immunodeficiency virus [HIV]: Secondary | ICD-10-CM | POA: Diagnosis not present

## 2020-12-27 DIAGNOSIS — R1032 Left lower quadrant pain: Secondary | ICD-10-CM

## 2020-12-27 DIAGNOSIS — R7989 Other specified abnormal findings of blood chemistry: Secondary | ICD-10-CM

## 2020-12-27 LAB — CBC WITH DIFFERENTIAL/PLATELET
Basophils Absolute: 0 K/uL (ref 0.0–0.1)
Basophils Relative: 0.7 % (ref 0.0–3.0)
Eosinophils Absolute: 0.1 K/uL (ref 0.0–0.7)
Eosinophils Relative: 2.3 % (ref 0.0–5.0)
HCT: 39.5 % (ref 36.0–46.0)
Hemoglobin: 13.4 g/dL (ref 12.0–15.0)
Lymphocytes Relative: 24.8 % (ref 12.0–46.0)
Lymphs Abs: 1.3 K/uL (ref 0.7–4.0)
MCHC: 33.8 g/dL (ref 30.0–36.0)
MCV: 87.3 fl (ref 78.0–100.0)
Monocytes Absolute: 0.4 K/uL (ref 0.1–1.0)
Monocytes Relative: 7 % (ref 3.0–12.0)
Neutro Abs: 3.5 K/uL (ref 1.4–7.7)
Neutrophils Relative %: 65.2 % (ref 43.0–77.0)
Platelets: 301 K/uL (ref 150.0–400.0)
RBC: 4.52 Mil/uL (ref 3.87–5.11)
RDW: 12.8 % (ref 11.5–15.5)
WBC: 5.3 K/uL (ref 4.0–10.5)

## 2020-12-27 LAB — COMPREHENSIVE METABOLIC PANEL
ALT: 10 U/L (ref 0–35)
AST: 15 U/L (ref 0–37)
Albumin: 4.7 g/dL (ref 3.5–5.2)
Alkaline Phosphatase: 42 U/L (ref 39–117)
BUN: 10 mg/dL (ref 6–23)
CO2: 29 mEq/L (ref 19–32)
Calcium: 9.7 mg/dL (ref 8.4–10.5)
Chloride: 104 mEq/L (ref 96–112)
Creatinine, Ser: 0.74 mg/dL (ref 0.40–1.20)
GFR: 96.78 mL/min (ref >60.00)
Glucose, Bld: 90 mg/dL (ref 70–99)
Potassium: 4.3 mEq/L (ref 3.5–5.1)
Sodium: 138 mEq/L (ref 135–145)
Total Bilirubin: 0.6 mg/dL (ref 0.2–1.2)
Total Protein: 7.8 g/dL (ref 6.0–8.3)

## 2020-12-27 LAB — TSH: TSH: 4.56 u[IU]/mL — ABNORMAL HIGH (ref 0.35–4.50)

## 2020-12-27 LAB — LIPID PANEL
Cholesterol: 256 mg/dL — ABNORMAL HIGH (ref 0–200)
HDL: 84.9 mg/dL (ref >39.00)
LDL Cholesterol: 157 mg/dL — ABNORMAL HIGH (ref 0–99)
NonHDL: 170.6
Total CHOL/HDL Ratio: 3
Triglycerides: 66 mg/dL (ref 0.0–149.0)
VLDL: 13.2 mg/dL (ref 0.0–40.0)

## 2020-12-27 NOTE — Assessment & Plan Note (Addendum)
Chronic Following with GI - Dr Lavon Paganini Has upped fiber and taking probiotics Only occasional pain in LLQ - ? Constipation and kidney stones To have a colonoscopy at some point, but will be a screening

## 2020-12-27 NOTE — Assessment & Plan Note (Signed)
Chronic Intermittent Improved with probiotics, fiber ? IBS with mild constipation +/- kidney stones

## 2020-12-27 NOTE — Assessment & Plan Note (Signed)
Chronic Likely genetic Check lipid panel, tsh  Lifestyle controlled Regular exercise and healthy diet encouraged

## 2020-12-28 LAB — HEPATITIS C ANTIBODY
Hepatitis C Ab: NONREACTIVE
SIGNAL TO CUT-OFF: 0.01 (ref <1.00)

## 2020-12-28 LAB — HIV ANTIBODY (ROUTINE TESTING W REFLEX): HIV 1&2 Ab, 4th Generation: NONREACTIVE

## 2020-12-30 NOTE — Addendum Note (Signed)
Addended by: Pincus Sanes on: 12/30/2020 08:01 AM   Modules accepted: Orders

## 2021-02-09 DIAGNOSIS — D2262 Melanocytic nevi of left upper limb, including shoulder: Secondary | ICD-10-CM | POA: Diagnosis not present

## 2021-02-09 DIAGNOSIS — L308 Other specified dermatitis: Secondary | ICD-10-CM | POA: Diagnosis not present

## 2021-02-09 DIAGNOSIS — D224 Melanocytic nevi of scalp and neck: Secondary | ICD-10-CM | POA: Diagnosis not present

## 2021-02-09 DIAGNOSIS — D2261 Melanocytic nevi of right upper limb, including shoulder: Secondary | ICD-10-CM | POA: Diagnosis not present

## 2021-02-09 DIAGNOSIS — D485 Neoplasm of uncertain behavior of skin: Secondary | ICD-10-CM | POA: Diagnosis not present

## 2021-03-15 ENCOUNTER — Encounter: Payer: Self-pay | Admitting: Gastroenterology

## 2021-03-27 DIAGNOSIS — Z8616 Personal history of COVID-19: Secondary | ICD-10-CM

## 2021-03-27 HISTORY — DX: Personal history of COVID-19: Z86.16

## 2021-03-28 ENCOUNTER — Other Ambulatory Visit (INDEPENDENT_AMBULATORY_CARE_PROVIDER_SITE_OTHER): Payer: BLUE CROSS/BLUE SHIELD

## 2021-03-28 DIAGNOSIS — Z01419 Encounter for gynecological examination (general) (routine) without abnormal findings: Secondary | ICD-10-CM | POA: Diagnosis not present

## 2021-03-28 DIAGNOSIS — Z1231 Encounter for screening mammogram for malignant neoplasm of breast: Secondary | ICD-10-CM | POA: Diagnosis not present

## 2021-03-28 DIAGNOSIS — R7989 Other specified abnormal findings of blood chemistry: Secondary | ICD-10-CM

## 2021-03-28 DIAGNOSIS — Z6825 Body mass index (BMI) 25.0-25.9, adult: Secondary | ICD-10-CM | POA: Diagnosis not present

## 2021-03-28 LAB — TSH: TSH: 3.02 u[IU]/mL (ref 0.35–4.50)

## 2021-03-29 ENCOUNTER — Encounter: Payer: Self-pay | Admitting: Internal Medicine

## 2021-04-01 ENCOUNTER — Other Ambulatory Visit: Payer: Self-pay | Admitting: Obstetrics and Gynecology

## 2021-04-01 DIAGNOSIS — Z9189 Other specified personal risk factors, not elsewhere classified: Secondary | ICD-10-CM

## 2021-04-06 NOTE — Progress Notes (Signed)
Subjective:    Patient ID: Lauren Reynolds, female    DOB: 1975-12-28, 45 y.o.   MRN: [id number]  HPI The patient is here for an acute visit.   Left upper quadrant abdominal pain --her pain is chronic and is a dully pain that is constant.  She has occ burning pain that she is feeling now.    Her pain has been worse over the past week. He started to get worse with a headache and low grade fever (99.8) and chills.   She has been seeing GI and work up has been negative.  She has not had a colonoscopy, but it is scheduled.  Dr Lavon Paganini feels her pain is not GI and is likely related to her kidney stones.   This week has been bad.  She has the dull pain in the LUQ and has a burning, seering pain as well.  She has increased gurgling and some tenderness in left upper side.  No change in pain with flatulence.  No constipation or diarrhea.  No blood in the stool.  Has a BM daily.    She has tried probiotics and still takes them.  She has tried gas-ex.  She has tried levsin which does not help.  She states advil has helped some.   A covid test was neg.       Medications and allergies reviewed with patient and updated if appropriate.  Patient Active Problem List   Diagnosis Date Noted  . Irritable bowel syndrome with constipation 12/27/2020  . Left lower quadrant abdominal pain 09/03/2020  . Allergic rhinitis 12/17/2019  . Hyperlipidemia 12/17/2019  . History of nephrolithiasis 12/17/2019  . Gallbladder polyp 12/17/2019  . Rosacea 12/17/2019    Current Outpatient Medications on File Prior to Visit  Medication Sig Dispense Refill  . Cholecalciferol (VITAMIN D) 2000 units CAPS Take 1 capsule by mouth daily.    . fexofenadine (ALLEGRA) 180 MG tablet Take 180 mg by mouth daily.    . metroNIDAZOLE (METROCREAM) 0.75 % cream Apply topically daily.    . Probiotic Product (PROBIOTIC DAILY PO) Take by mouth. JustThrive probiotic take one daily     No current facility-administered  medications on file prior to visit.    Past Medical History:  Diagnosis Date  . History of kidney stones     Past Surgical History:  Procedure Laterality Date  . ABLATION    . AUGMENTATION MAMMAPLASTY Bilateral 2016   Implants removed 2016  . BREAST ENHANCEMENT SURGERY    . BREAST IMPLANT REMOVAL    . CESAREAN SECTION     x2    Social History   Socioeconomic History  . Marital status: Married    Spouse name: Not on file  . Number of children: 2  . Years of education: Not on file  . Highest education level: Not on file  Occupational History  . Not on file  Tobacco Use  . Smoking status: Former Games developer  . Smokeless tobacco: Never Used  . Tobacco comment: high school  Vaping Use  . Vaping Use: Never used  Substance and Sexual Activity  . Alcohol use: Yes    Comment: 1 drink most nights  . Drug use: Never  . Sexual activity: Yes  Other Topics Concern  . Not on file  Social History Narrative  . Not on file   Social Determinants of Health   Financial Resource Strain: Not on file  Food Insecurity: Not on file  Transportation Needs: Not  on file  Physical Activity: Not on file  Stress: Not on file  Social Connections: Not on file    Family History  Problem Relation Age of Onset  . Breast cancer Mother 48       triple neg  . Hyperlipidemia Mother   . Breast cancer Maternal Aunt   . Esophageal cancer Maternal Uncle   . Breast cancer Paternal Aunt   . Breast cancer Paternal Grandmother   . Thyroid nodules Sister     Review of Systems  Constitutional: Positive for fever (low grade fever).  Respiratory: Negative for cough, shortness of breath and wheezing.   Cardiovascular: Negative for chest pain and palpitations.  Gastrointestinal: Positive for abdominal pain. Negative for blood in stool, constipation, diarrhea and nausea.  Genitourinary: Negative for dysuria, frequency and hematuria.  Musculoskeletal: Negative for arthralgias and myalgias.  Skin: Negative  for rash.  Neurological: Positive for headaches (one day).       Objective:   Vitals:   04/07/21 0807  BP: 112/74  Pulse: 80  Temp: 98.6 F (37 C)  SpO2: 99%   BP Readings from Last 3 Encounters:  04/07/21 112/74  12/27/20 116/78  11/17/20 132/72   Wt Readings from Last 3 Encounters:  04/07/21 136 lb 3.2 oz (61.8 kg)  12/27/20 138 lb 9.6 oz (62.9 kg)  11/17/20 136 lb (61.7 kg)   Body mass index is 24.91 kg/m.   Physical Exam Constitutional:      General: She is not in acute distress.    Appearance: She is well-developed. She is not ill-appearing.  HENT:     Head: Normocephalic and atraumatic.  Abdominal:     General: Abdomen is flat. Bowel sounds are normal.     Palpations: Abdomen is soft. There is no hepatomegaly or splenomegaly.     Tenderness: There is abdominal tenderness (mild) in the left upper quadrant. There is no right CVA tenderness, left CVA tenderness, guarding or rebound.     Hernia: A hernia is present. Hernia is present in the umbilical area.  Skin:    General: Skin is warm and dry.  Neurological:     Mental Status: She is alert.          CT Abdomen Pelvis W Contrast CLINICAL DATA:  Left lower quadrant pain for 2 months  EXAM: CT ABDOMEN AND PELVIS WITH CONTRAST  TECHNIQUE: Multidetector CT imaging of the abdomen and pelvis was performed using the standard protocol following bolus administration of intravenous contrast.  CONTRAST:  OMNIPAQUE IOHEXOL 300 MG/ML  SOLN  COMPARISON:  05/21/2018  FINDINGS: Lower chest: No acute abnormality.  Hepatobiliary: No focal liver abnormality is seen. No gallstones, gallbladder wall thickening, or biliary dilatation.  Pancreas: Unremarkable. No pancreatic ductal dilatation or surrounding inflammatory changes.  Spleen: Normal in size without focal abnormality.  Adrenals/Urinary Tract: Unremarkable adrenal glands. There are a few 3 mm nonobstructing stones within the left kidney and a  single 2 mm nonobstructing stone within the right kidney. Kidneys enhance symmetrically. No hydronephrosis. Bilateral ureters are unremarkable. No ureteral calculi. Urinary bladder is normal in appearance.  Stomach/Bowel: Stomach and bowel are well distended with oral contrast. No dilated loops of bowel to suggest obstruction. Mild long segment thickening of small-bowel loops within the left upper quadrant. No focal colonic wall thickening. No inflammatory changes. Moderate volume of stool within the colon.  Vascular/Lymphatic: No significant vascular findings are present. No enlarged abdominal or pelvic lymph nodes.  Reproductive: Retroverted uterus. Probable degenerating follicle  within the left ovary (series 2, image 59). Unremarkable right adnexa  Other: Small volume of free fluid within the cul-de-sac. No organized abdominopelvic fluid collection. No pneumoperitoneum. Small fat containing umbilical hernia.  Musculoskeletal: No acute or significant osseous findings.  IMPRESSION: 1. Mild long segment thickening of small-bowel loops within the left upper quadrant suggestive of a nonspecific enteritis. No evidence of bowel obstruction. No colonic inflammation or evidence of diverticulitis, as clinically questioned. 2. Bilateral nonobstructing nephrolithiasis. 3. Probable degenerating follicle within the left ovary. 4. Small volume of free fluid within the cul-de-sac, which may be physiologic/related to ruptured cyst. 5. Small fat containing umbilical hernia.  Electronically Signed   By: Duanne Guess D.O.   On: 09/03/2020 15:09  Lab Results  Component Value Date   WBC 5.8 04/07/2021   HGB 12.8 04/07/2021   HCT 37.8 04/07/2021   PLT 257.0 04/07/2021   GLUCOSE 97 04/07/2021   CHOL 256 (H) 12/27/2020   TRIG 66.0 12/27/2020   HDL 84.90 12/27/2020   LDLCALC 157 (H) 12/27/2020   ALT 11 04/07/2021   AST 16 04/07/2021   NA 139 04/07/2021   K 4.8 04/07/2021   CL 105  04/07/2021   CREATININE 0.78 04/07/2021   BUN 12 04/07/2021   CO2 27 04/07/2021   TSH 3.02 03/28/2021     Assessment & Plan:    See Problem List for Assessment and Plan of chronic medical problems.    This visit occurred during the SARS-CoV-2 public health emergency.  Safety protocols were in place, including screening questions prior to the visit, additional usage of staff PPE, and extensive cleaning of exam room while observing appropriate contact time as indicated for disinfecting solutions.

## 2021-04-07 ENCOUNTER — Ambulatory Visit (INDEPENDENT_AMBULATORY_CARE_PROVIDER_SITE_OTHER): Payer: BLUE CROSS/BLUE SHIELD | Admitting: Internal Medicine

## 2021-04-07 ENCOUNTER — Encounter: Payer: Self-pay | Admitting: Internal Medicine

## 2021-04-07 ENCOUNTER — Other Ambulatory Visit: Payer: Self-pay

## 2021-04-07 VITALS — BP 112/74 | HR 80 | Temp 98.6°F | Ht 62.0 in | Wt 136.2 lb

## 2021-04-07 DIAGNOSIS — R350 Frequency of micturition: Secondary | ICD-10-CM | POA: Insufficient documentation

## 2021-04-07 DIAGNOSIS — R1012 Left upper quadrant pain: Secondary | ICD-10-CM | POA: Diagnosis not present

## 2021-04-07 DIAGNOSIS — G8929 Other chronic pain: Secondary | ICD-10-CM | POA: Diagnosis not present

## 2021-04-07 LAB — URINALYSIS, ROUTINE W REFLEX MICROSCOPIC
Bilirubin Urine: NEGATIVE
Hgb urine dipstick: NEGATIVE
Ketones, ur: NEGATIVE
Leukocytes,Ua: NEGATIVE
Nitrite: NEGATIVE
RBC / HPF: NONE SEEN (ref 0–?)
Specific Gravity, Urine: <=1.005 — AB (ref 1.000–1.030)
Total Protein, Urine: NEGATIVE
Urine Glucose: NEGATIVE
Urobilinogen, UA: 0.2 (ref 0.0–1.0)
pH: 6 (ref 5.0–8.0)

## 2021-04-07 LAB — COMPREHENSIVE METABOLIC PANEL
ALT: 11 U/L (ref 0–35)
AST: 16 U/L (ref 0–37)
Albumin: 4.7 g/dL (ref 3.5–5.2)
Alkaline Phosphatase: 40 U/L (ref 39–117)
BUN: 12 mg/dL (ref 6–23)
CO2: 27 mEq/L (ref 19–32)
Calcium: 9.7 mg/dL (ref 8.4–10.5)
Chloride: 105 mEq/L (ref 96–112)
Creatinine, Ser: 0.78 mg/dL (ref 0.40–1.20)
GFR: 90.68 mL/min (ref >60.00)
Glucose, Bld: 97 mg/dL (ref 70–99)
Potassium: 4.8 mEq/L (ref 3.5–5.1)
Sodium: 139 mEq/L (ref 135–145)
Total Bilirubin: 0.5 mg/dL (ref 0.2–1.2)
Total Protein: 7.7 g/dL (ref 6.0–8.3)

## 2021-04-07 LAB — CBC WITH DIFFERENTIAL/PLATELET
Basophils Absolute: 0 10*3/uL (ref 0.0–0.1)
Basophils Relative: 0.7 % (ref 0.0–3.0)
Eosinophils Absolute: 0.2 10*3/uL (ref 0.0–0.7)
Eosinophils Relative: 3.8 % (ref 0.0–5.0)
HCT: 37.8 % (ref 36.0–46.0)
Hemoglobin: 12.8 g/dL (ref 12.0–15.0)
Lymphocytes Relative: 21.2 % (ref 12.0–46.0)
Lymphs Abs: 1.2 10*3/uL (ref 0.7–4.0)
MCHC: 33.8 g/dL (ref 30.0–36.0)
MCV: 87.7 fl (ref 78.0–100.0)
Monocytes Absolute: 0.4 10*3/uL (ref 0.1–1.0)
Monocytes Relative: 7.7 % (ref 3.0–12.0)
Neutro Abs: 3.9 10*3/uL (ref 1.4–7.7)
Neutrophils Relative %: 66.6 % (ref 43.0–77.0)
Platelets: 257 10*3/uL (ref 150.0–400.0)
RBC: 4.32 Mil/uL (ref 3.87–5.11)
RDW: 12.8 % (ref 11.5–15.5)
WBC: 5.8 10*3/uL (ref 4.0–10.5)

## 2021-04-07 LAB — SEDIMENTATION RATE: Sed Rate: 13 mm/hr (ref 0–20)

## 2021-04-07 LAB — C-REACTIVE PROTEIN: CRP: <1 mg/dL (ref 0.5–20.0)

## 2021-04-07 MED ORDER — MELOXICAM 15 MG PO TABS
15.0000 mg | ORAL_TABLET | Freq: Every day | ORAL | 0 refills | Status: DC | PRN
Start: 2021-04-07 — End: 2021-08-17

## 2021-04-07 NOTE — Patient Instructions (Addendum)
  Blood work was ordered.     Medications changes include :   meloxicam 15 mg daily with food  Your prescription(s) have been submitted to your pharmacy. Please take as directed and contact our office if you believe you are having problem(s) with the medication(s).   A referral was ordered for [facility].     Someone from their office will call you to schedule an appointment.

## 2021-04-07 NOTE — Assessment & Plan Note (Signed)
Acute Doubt uti, but will check UA, UCx

## 2021-04-07 NOTE — Assessment & Plan Note (Signed)
Acute on chronic pain in LUQ Chronic dull mild pain, episodes of more severe pain with a burning sensation - this episode started about one week ago No change in bowels Has had a headache one day and persistent low grade fever - no other symptoms Seems less likely to be renal stones since they are in the kidney ? Chronic SB enteritis Has colonoscopy scheduled Would like second opinion - will refer to GI at [facility], cmp, Ua, ucx Will hold off on Ct of Ab/pelvis for now, but may need to consider it meloxicam 15 mg daily with food x 1-2 weeks to see if it helps

## 2021-04-09 LAB — URINE CULTURE: Result:: NO GROWTH

## 2021-04-09 LAB — ANA: Anti Nuclear Antibody (ANA): NEGATIVE

## 2021-05-09 ENCOUNTER — Encounter: Payer: BLUE CROSS/BLUE SHIELD | Admitting: Gastroenterology

## 2021-06-01 ENCOUNTER — Other Ambulatory Visit: Payer: Self-pay

## 2021-06-01 ENCOUNTER — Ambulatory Visit (AMBULATORY_SURGERY_CENTER): Payer: BLUE CROSS/BLUE SHIELD | Admitting: *Deleted

## 2021-06-01 VITALS — Ht 62.0 in | Wt 136.0 lb

## 2021-06-01 DIAGNOSIS — Z1211 Encounter for screening for malignant neoplasm of colon: Secondary | ICD-10-CM

## 2021-06-01 NOTE — Progress Notes (Signed)
Pt's previsit is done over the phone and all paperwork (prep instructions, blank consent form to just read over) sent to patient.  Pt's name and DOB verified at the beginning of the previsit.  Pt denies any difficulty with ambulating.    Pt states she might have an issue with "waking up after surgery in 2016", denies being told they were difficult to intubate, or hx/fam hx of malignant hyperthermia per pt  Pt has Suprep at home already   No egg or soy allergy  No home oxygen use   No medications for weight loss taken  emmi information given  Pt denies constipation issues

## 2021-06-13 ENCOUNTER — Encounter: Payer: Self-pay | Admitting: Gastroenterology

## 2021-06-15 ENCOUNTER — Ambulatory Visit (AMBULATORY_SURGERY_CENTER): Payer: BLUE CROSS/BLUE SHIELD | Admitting: Gastroenterology

## 2021-06-15 ENCOUNTER — Encounter: Payer: Self-pay | Admitting: Gastroenterology

## 2021-06-15 ENCOUNTER — Other Ambulatory Visit: Payer: Self-pay

## 2021-06-15 VITALS — BP 106/70 | HR 72 | Temp 98.6°F | Resp 12 | Ht 62.0 in | Wt 136.0 lb

## 2021-06-15 DIAGNOSIS — R109 Unspecified abdominal pain: Secondary | ICD-10-CM

## 2021-06-15 DIAGNOSIS — Z1211 Encounter for screening for malignant neoplasm of colon: Secondary | ICD-10-CM | POA: Diagnosis not present

## 2021-06-15 HISTORY — PX: COLONOSCOPY: SHX174

## 2021-06-15 MED ORDER — SODIUM CHLORIDE 0.9 % IV SOLN: 500.0000 mL | Freq: Once | INTRAVENOUS | Status: DC

## 2021-06-15 NOTE — Op Note (Signed)
[facility] Patient Name: Lauren Reynolds Procedure Date: 06/15/2021 12:32 PM MRN: [id number] Endoscopist: Napoleon Form , MD Age: 45 Referring MD:  Date of Birth: 03/05/1976 Gender: Female Account #: [id number] Procedure:                Colonoscopy Indications:              Screening for colorectal malignant neoplasm,                            Incidental abdominal pain noted Medicines:                Monitored Anesthesia Care Procedure:                Pre-Anesthesia Assessment:                           - Prior to the procedure, a History and Physical                            was performed, and patient medications and                            allergies were reviewed. The patient's tolerance of                            previous anesthesia was also reviewed. The risks                            and benefits of the procedure and the sedation                            options and risks were discussed with the patient.                            All questions were answered, and informed consent                            was obtained. Prior Anticoagulants: The patient has                            taken no previous anticoagulant or antiplatelet                            agents. ASA Grade Assessment: II - A patient with                            mild systemic disease. After reviewing the risks                            and benefits, the patient was deemed in                            satisfactory condition to undergo the procedure.  After obtaining informed consent, the colonoscope                            was passed under direct vision. Throughout the                            procedure, the patient's blood pressure, pulse, and                            oxygen saturations were monitored continuously. The                            Olympus PCF-H190DL (#[id number]) Colonoscope was                            introduced through the anus and  advanced to the the                            cecum, identified by appendiceal orifice and                            ileocecal valve. The colonoscopy was performed                            without difficulty. The patient tolerated the                            procedure well. The quality of the bowel                            preparation was excellent. The terminal ileum,                            ileocecal valve, appendiceal orifice, and rectum                            were photographed. Scope In: 1:18:33 PM Scope Out: 1:28:35 PM Scope Withdrawal Time: 0 hours 6 minutes 10 seconds  Total Procedure Duration: 0 hours 10 minutes 2 seconds  Findings:                 The perianal and digital rectal examinations were                            normal.                           The colon (entire examined portion) appeared normal. Complications:            No immediate complications. Estimated Blood Loss:     Estimated blood loss: none. Impression:               - The entire examined colon is normal.                           - No specimens collected. Recommendation:           -  Patient has a contact number available for                            emergencies. The signs and symptoms of potential                            delayed complications were discussed with the                            patient. Return to normal activities tomorrow.                            Written discharge instructions were provided to the                            patient.                           - Resume previous diet.                           - Continue present medications.                           - Repeat colonoscopy in 10 years for screening                            purposes. Napoleon Form, MD 06/15/2021 1:37:12 PM This report has been signed electronically.

## 2021-06-15 NOTE — Progress Notes (Signed)
Report given to PACU, vss 

## 2021-06-15 NOTE — Progress Notes (Deleted)
Pt's states no medical or surgical changes since previsit or office visit.   VS taken by CW 

## 2021-06-15 NOTE — Progress Notes (Signed)
Pt's states no medical or surgical changes since previsit or office visit.   VS taken by DT 

## 2021-06-15 NOTE — Patient Instructions (Signed)
Resume previous diet Continue current medications Repeat colonoscopy in 10 years!!!!!!  YOU HAD AN ENDOSCOPIC PROCEDURE TODAY AT THE [facility]:   Refer to the procedure report that was given to you for any specific questions about what was found during the examination.  If the procedure report does not answer your questions, please call your gastroenterologist to clarify.  If you requested that your care partner not be given the details of your procedure findings, then the procedure report has been included in a sealed envelope for you to review at your convenience later.  YOU SHOULD EXPECT: Some feelings of bloating in the abdomen. Passage of more gas than usual.  Walking can help get rid of the air that was put into your GI tract during the procedure and reduce the bloating. If you had a lower endoscopy (such as a colonoscopy or flexible sigmoidoscopy) you may notice spotting of blood in your stool or on the toilet paper. If you underwent a bowel prep for your procedure, you may not have a normal bowel movement for a few days.  Please Note:  You might notice some irritation and congestion in your nose or some drainage.  This is from the oxygen used during your procedure.  There is no need for concern and it should clear up in a day or so.  SYMPTOMS TO REPORT IMMEDIATELY:  Following lower endoscopy (colonoscopy or flexible sigmoidoscopy):  Excessive amounts of blood in the stool  Significant tenderness or worsening of abdominal pains  Swelling of the abdomen that is new, acute  Fever of 100F or higher  For urgent or emergent issues, a gastroenterologist can be reached at any hour by calling [phone]. Do not use MyChart messaging for urgent concerns.   DIET:  We do recommend a small meal at first, but then you may proceed to your regular diet.  Drink plenty of fluids but you should avoid alcoholic beverages for 24 hours.  ACTIVITY:  You should plan to take it easy for the  rest of today and you should NOT DRIVE or use heavy machinery until tomorrow (because of the sedation medicines used during the test).    FOLLOW UP: Our staff will call the number listed on your records 48-72 hours following your procedure to check on you and address any questions or concerns that you may have regarding the information given to you following your procedure. If we do not reach you, we will leave a message.  We will attempt to reach you two times.  During this call, we will ask if you have developed any symptoms of COVID 19. If you develop any symptoms (ie: fever, flu-like symptoms, shortness of breath, cough etc.) before then, please call [phone].  If you test positive for Covid 19 in the 2 weeks post procedure, please call and report this information to us.    If any biopsies were taken you will be contacted by phone or by letter within the next 1-3 weeks.  Please call us at [phone] if you have not heard about the biopsies in 3 weeks.   SIGNATURES/CONFIDENTIALITY: You and/or your care partner have signed paperwork which will be entered into your electronic medical record.  These signatures attest to the fact that that the information above on your After Visit Summary has been reviewed and is understood.  Full responsibility of the confidentiality of this discharge information lies with you and/or your care-partner.  

## 2021-06-17 ENCOUNTER — Telehealth: Payer: Self-pay

## 2021-06-17 NOTE — Telephone Encounter (Signed)
LVM

## 2021-07-06 DIAGNOSIS — R1013 Epigastric pain: Secondary | ICD-10-CM | POA: Diagnosis not present

## 2021-07-06 DIAGNOSIS — R1012 Left upper quadrant pain: Secondary | ICD-10-CM | POA: Diagnosis not present

## 2021-07-06 DIAGNOSIS — R140 Abdominal distension (gaseous): Secondary | ICD-10-CM | POA: Diagnosis not present

## 2021-07-27 DIAGNOSIS — R1013 Epigastric pain: Secondary | ICD-10-CM | POA: Diagnosis not present

## 2021-08-15 DIAGNOSIS — N83291 Other ovarian cyst, right side: Secondary | ICD-10-CM | POA: Diagnosis not present

## 2021-08-16 ENCOUNTER — Telehealth: Payer: Self-pay | Admitting: Gastroenterology

## 2021-08-16 ENCOUNTER — Encounter: Payer: Self-pay | Admitting: Internal Medicine

## 2021-08-16 DIAGNOSIS — Z1509 Genetic susceptibility to other malignant neoplasm: Secondary | ICD-10-CM | POA: Insufficient documentation

## 2021-08-16 NOTE — Telephone Encounter (Signed)
Good afternoon Dr. Lavon Paganini, patient called requesting to schedule an appt.  She was seen here for a procedure on 05/2021 then went to [facility] in 06/2021 for a second opinion.  She now wants to schedule an appt with you.  Records are in Epic.  Can you please review and advise on scheduling?  Thank you.

## 2021-08-16 NOTE — Telephone Encounter (Signed)
Ok to schedule next available appointment.  

## 2021-08-16 NOTE — Progress Notes (Signed)
Subjective:    Patient ID: Lauren Reynolds, female    DOB: February 09, 1976, 45 y.o.   MRN: [id number]  This visit occurred during the SARS-CoV-2 public health emergency.  Safety protocols were in place, including screening questions prior to the visit, additional usage of staff PPE, and extensive cleaning of exam room while observing appropriate contact time as indicated for disinfecting solutions.    HPI The patient is here for an acute visit.   She had genetic testing and recently found out her MSH6 gene was upgraded to a mutation putting her under the umbrella of Lynch Syndrome.    Next week going to pelvic US and will have a complete hysterectomy.    Still having left sided pain - she is getting a referral for urology from Dr Julien Girt.  She has an appointment November 1.   The genetic counselor advised her to see  a comprehensive cancer doctor.  She is looking into [facility] - they have a lynch syndrome specialist.  She continues to have constant left sided abdominal pain.  It can be a strong ache to a hot coal pain.  No association with food/drink or when she eats/drinks.  Her colonoscopy was normal.  To have EGD soon.  H pylori was negative.  No relation to position or activity.  Maybe a little better when she forces her abdomen out.  Sometimes her mid left back will go numb or radiating.  Her left kidney hurts on occasion.     Medications and allergies reviewed with patient and updated if appropriate.  Patient Active Problem List   Diagnosis Date Noted   Lynch syndrome 08/16/2021   Urinary frequency 04/07/2021   Irritable bowel syndrome with constipation 12/27/2020   Chronic LUQ pain 09/03/2020   Allergic rhinitis 12/17/2019   Hyperlipidemia 12/17/2019   History of nephrolithiasis 12/17/2019   Gallbladder polyp 12/17/2019   Rosacea 12/17/2019    Current Outpatient Medications on File Prior to Visit  Medication Sig Dispense Refill   Cholecalciferol (VITAMIN D) 2000 units CAPS  Take 1 capsule by mouth daily.     fexofenadine (ALLEGRA) 180 MG tablet Take 180 mg by mouth daily.     metroNIDAZOLE (METROCREAM) 0.75 % cream Apply topically daily. PRN     NON FORMULARY Anxiocalm- herbal supplement for anxiety- takes BID     Probiotic Product (PROBIOTIC DAILY PO) Take by mouth. JustThrive probiotic take one daily     No current facility-administered medications on file prior to visit.    Past Medical History:  Diagnosis Date   Allergy    Anxiety    History of kidney stones    Hyperlipidemia     Past Surgical History:  Procedure Laterality Date   ABLATION     AUGMENTATION MAMMAPLASTY Bilateral 2016   Implants removed 2016   BREAST ENHANCEMENT SURGERY     BREAST IMPLANT REMOVAL     CESAREAN SECTION     x2   DILATION AND CURETTAGE OF UTERUS     TONSILLECTOMY      Social History   Socioeconomic History   Marital status: Married    Spouse name: Not on file   Number of children: 2   Years of education: Not on file   Highest education level: Not on file  Occupational History   Not on file  Tobacco Use   Smoking status: Former   Smokeless tobacco: Never   Tobacco comments:    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  Use: Never used  Substance and Sexual Activity   Alcohol use: Yes    Comment: 1 drink most nights   Drug use: Never   Sexual activity: Yes  Other Topics Concern   Not on file  Social History Narrative   Not on file   Social Determinants of Health   Financial Resource Strain: Not on file  Food Insecurity: Not on file  Transportation Needs: Not on file  Physical Activity: Not on file  Stress: Not on file  Social Connections: Not on file    Family History  Problem Relation Age of Onset   Breast cancer Mother 74       triple neg   Hyperlipidemia Mother    Thyroid nodules Sister    Breast cancer Maternal Aunt    Esophageal cancer Maternal Uncle    Breast cancer Paternal Aunt    Breast cancer Paternal Grandmother    Colon cancer  Neg Hx    Stomach cancer Neg Hx    Rectal cancer Neg Hx     Review of Systems  Constitutional:  Positive for appetite change (sometimes with increased pain). Negative for fever.  Gastrointestinal:  Positive for abdominal pain.  Genitourinary:  Positive for frequency and urgency. Negative for difficulty urinating, dysuria and hematuria.       Normal urine color, no abnormal odor      Objective:   Vitals:   08/17/21 0751  BP: 110/70  Pulse: 87  Temp: 98.3 F (36.8 C)  SpO2: 98%   BP Readings from Last 3 Encounters:  08/17/21 110/70  06/15/21 106/70  04/07/21 112/74   Wt Readings from Last 3 Encounters:  08/17/21 139 lb (63 kg)  06/15/21 136 lb (61.7 kg)  06/01/21 136 lb (61.7 kg)   Body mass index is 25.42 kg/m.   Physical Exam Constitutional:      General: She is not in acute distress.    Appearance: Normal appearance. She is not ill-appearing.  HENT:     Head: Normocephalic and atraumatic.  Abdominal:     General: There is no distension.     Palpations: Abdomen is soft. There is no mass.     Tenderness: There is no abdominal tenderness. There is no right CVA tenderness, left CVA tenderness, guarding or rebound.  Musculoskeletal:        General: Tenderness (There is some tenderness on her lateral 12th rib.) present.     Right lower leg: No edema.     Left lower leg: No edema.  Skin:    General: Skin is warm and dry.  Neurological:     Mental Status: She is alert.       CT Abdomen Pelvis W Contrast CLINICAL DATA:  Left lower quadrant pain for 2 months  EXAM: CT ABDOMEN AND PELVIS WITH CONTRAST  TECHNIQUE: Multidetector CT imaging of the abdomen and pelvis was performed using the standard protocol following bolus administration of intravenous contrast.  CONTRAST:  152m OMNIPAQUE IOHEXOL 300 MG/ML  SOLN  COMPARISON:  05/21/2018  FINDINGS: Lower chest: No acute abnormality.  Hepatobiliary: No focal liver abnormality is seen. No  gallstones, gallbladder wall thickening, or biliary dilatation.  Pancreas: Unremarkable. No pancreatic ductal dilatation or surrounding inflammatory changes.  Spleen: Normal in size without focal abnormality.  Adrenals/Urinary Tract: Unremarkable adrenal glands. There are a few 3 mm nonobstructing stones within the left kidney and a single 2 mm nonobstructing stone within the right kidney. Kidneys enhance symmetrically. No hydronephrosis. Bilateral ureters are unremarkable.  No ureteral calculi. Urinary bladder is normal in appearance.  Stomach/Bowel: Stomach and bowel are well distended with oral contrast. No dilated loops of bowel to suggest obstruction. Mild long segment thickening of small-bowel loops within the left upper quadrant. No focal colonic wall thickening. No inflammatory changes. Moderate volume of stool within the colon.  Vascular/Lymphatic: No significant vascular findings are present. No enlarged abdominal or pelvic lymph nodes.  Reproductive: Retroverted uterus. Probable degenerating follicle within the left ovary (series 2, image 59). Unremarkable right adnexa  Other: Small volume of free fluid within the cul-de-sac. No organized abdominopelvic fluid collection. No pneumoperitoneum. Small fat containing umbilical hernia.  Musculoskeletal: No acute or significant osseous findings.  IMPRESSION: 1. Mild long segment thickening of small-bowel loops within the left upper quadrant suggestive of a nonspecific enteritis. No evidence of bowel obstruction. No colonic inflammation or evidence of diverticulitis, as clinically questioned. 2. Bilateral nonobstructing nephrolithiasis. 3. Probable degenerating follicle within the left ovary. 4. Small volume of free fluid within the cul-de-sac, which may be physiologic/related to ruptured cyst. 5. Small fat containing umbilical hernia.  Electronically Signed   By: Davina Poke D.O.   On: 09/03/2020  15:09     Assessment & Plan:   I spent 20 minutes dedicated to the care of this patient on the date of this encounter including review of recent labs, imaging and procedures, speciality notes, obtaining history, communicating with the patient, ordering medications, tests, and documenting clinical information in the EHR    See Problem List for Assessment and Plan of chronic medical problems.

## 2021-08-17 ENCOUNTER — Other Ambulatory Visit: Payer: Self-pay

## 2021-08-17 ENCOUNTER — Ambulatory Visit (INDEPENDENT_AMBULATORY_CARE_PROVIDER_SITE_OTHER): Payer: BLUE CROSS/BLUE SHIELD | Admitting: Internal Medicine

## 2021-08-17 ENCOUNTER — Encounter: Payer: Self-pay | Admitting: Internal Medicine

## 2021-08-17 VITALS — BP 110/70 | HR 87 | Temp 98.3°F | Wt 139.0 lb

## 2021-08-17 DIAGNOSIS — Z1509 Genetic susceptibility to other malignant neoplasm: Secondary | ICD-10-CM

## 2021-08-17 DIAGNOSIS — Z87442 Personal history of urinary calculi: Secondary | ICD-10-CM | POA: Diagnosis not present

## 2021-08-17 DIAGNOSIS — R1012 Left upper quadrant pain: Secondary | ICD-10-CM | POA: Diagnosis not present

## 2021-08-17 LAB — URINALYSIS, ROUTINE W REFLEX MICROSCOPIC
Bilirubin Urine: NEGATIVE
Hgb urine dipstick: NEGATIVE
Ketones, ur: NEGATIVE
Leukocytes,Ua: NEGATIVE
Nitrite: NEGATIVE
RBC / HPF: NONE SEEN (ref 0–?)
Specific Gravity, Urine: <1.005 — AB (ref 1.000–1.030)
Total Protein, Urine: NEGATIVE
Urine Glucose: NEGATIVE
Urobilinogen, UA: 0.2 — AB (ref 0.0–1.0)
pH: 6 (ref 5.0–8.0)

## 2021-08-17 NOTE — Telephone Encounter (Signed)
Patient has been scheduled for 09/05/21 and 09/19/21.

## 2021-08-17 NOTE — Assessment & Plan Note (Signed)
To have a pelvic exam and CA125 blood work.  Will likely have a complete hysterectomy" if both are normal in the spring  Has follow-up with GI to have the EGD and we will discuss screening colonoscopies in which interval that should be at  Has looked into [facility] and they do have a Lynch syndrome specialist in hopes to establish therapy  To see urology in November 1

## 2021-08-17 NOTE — Assessment & Plan Note (Signed)
Chronic Has bilateral stones on CT scan last year and has passed 1 stone in the past Has chronic left sided pain that has worsened recently ?  Related to left kidney stone, obstruction Has an appointment with urology November 1 Will check urinalysis, urine culture and get CT renal to evaluate further

## 2021-08-17 NOTE — Patient Instructions (Addendum)
  Urine tests ordered.     Medications changes include :   none   A Ct scan of your kidneys was ordered.  Someone will call you to schedule an appointment.    A referral was ordered for sports medicine.

## 2021-08-17 NOTE — Assessment & Plan Note (Signed)
Acute on chronic Has had left upper quadrant/side pain for a while, but it has gotten worse recently She recently found out she is also under the umbrella of Lynch syndrome and is concerned about the possibility of cancer No explanation for the pain has been found Colonoscopy was normal, test for H. pylori was negative Pain is not related to eating, drinking.  No relation to position or activity.  No urinary symptoms except for frequency/urgency and that has not changed ?  Related to known kidney stones-we will obtain urinalysis, urine culture and CT renal.  Has an appointment with urology November 1 ?  Musculoskeletal-she does have some tenderness with palpation of her 12th rib-Will refer to sports medicine Has follow-up with GI

## 2021-08-18 ENCOUNTER — Ambulatory Visit (INDEPENDENT_AMBULATORY_CARE_PROVIDER_SITE_OTHER)
Admission: RE | Admit: 2021-08-18 | Discharge: 2021-08-18 | Disposition: A | Discharge status: Home / Self Care | Payer: BLUE CROSS/BLUE SHIELD | Source: Ambulatory Visit | Attending: Internal Medicine | Admitting: Internal Medicine

## 2021-08-18 DIAGNOSIS — R1012 Left upper quadrant pain: Secondary | ICD-10-CM

## 2021-08-18 DIAGNOSIS — Z87442 Personal history of urinary calculi: Secondary | ICD-10-CM

## 2021-08-18 DIAGNOSIS — N200 Calculus of kidney: Secondary | ICD-10-CM | POA: Diagnosis not present

## 2021-08-18 LAB — URINE CULTURE: Result:: NO GROWTH

## 2021-08-23 DIAGNOSIS — Z1509 Genetic susceptibility to other malignant neoplasm: Secondary | ICD-10-CM | POA: Diagnosis not present

## 2021-08-23 DIAGNOSIS — N83292 Other ovarian cyst, left side: Secondary | ICD-10-CM | POA: Diagnosis not present

## 2021-08-27 DIAGNOSIS — K259 Gastric ulcer, unspecified as acute or chronic, without hemorrhage or perforation: Secondary | ICD-10-CM

## 2021-08-27 HISTORY — DX: Gastric ulcer, unspecified as acute or chronic, without hemorrhage or perforation: K25.9

## 2021-09-02 NOTE — Progress Notes (Deleted)
[facility] [address] [place] [address] Phone: [phone] Subjective:    I'm seeing this patient by the request  of:  Pincus Sanes, MD  CC:   [id number]  Lauren Reynolds is a 45 y.o. female coming in with complaint of LUQ pain that has been worsening recently. Saw PCP who referred patient to [place]. Patient also referred to GI. CT kidneys also ordered by PCP. Patient states       Past Medical History:  Diagnosis Date   Allergy    Anxiety    History of kidney stones    Hyperlipidemia    Past Surgical History:  Procedure Laterality Date   ABLATION     AUGMENTATION MAMMAPLASTY Bilateral 2016   Implants removed 2016   BREAST ENHANCEMENT SURGERY     BREAST IMPLANT REMOVAL     CESAREAN SECTION     x2   DILATION AND CURETTAGE OF UTERUS     TONSILLECTOMY     Social History   Socioeconomic History   Marital status: Married    Spouse name: Not on file   Number of children: 2   Years of education: Not on file   Highest education level: Not on file  Occupational History   Not on file  Tobacco Use   Smoking status: Former   Smokeless tobacco: Never   Tobacco comments:    high school  Vaping Use   Vaping Use: Never used  Substance and Sexual Activity   Alcohol use: Yes    Comment: 1 drink most nights   Drug use: Never   Sexual activity: Yes  Other Topics Concern   Not on file  Social History Narrative   Not on file   Social Determinants of Health   Financial Resource Strain: Not on file  Food Insecurity: Not on file  Transportation Needs: Not on file  Physical Activity: Not on file  Stress: Not on file  Social Connections: Not on file   Allergies  Allergen Reactions   Neosporin Plus Max St Rash   Codeine Nausea Only   Family History  Problem Relation Age of Onset   Breast cancer Mother 64       triple neg   Hyperlipidemia Mother    Thyroid nodules Sister    Breast cancer Maternal Aunt    Esophageal  cancer Maternal Uncle    Breast cancer Paternal Aunt    Breast cancer Paternal Grandmother    Colon cancer Neg Hx    Stomach cancer Neg Hx    Rectal cancer Neg Hx       Current Outpatient Medications (Respiratory):    fexofenadine (ALLEGRA) 180 MG tablet, Take 180 mg by mouth daily.    Current Outpatient Medications (Other):    Cholecalciferol (VITAMIN D) 2000 units CAPS, Take 1 capsule by mouth daily.   metroNIDAZOLE (METROCREAM) 0.75 % cream, Apply topically daily. PRN   NON FORMULARY, Anxiocalm- herbal supplement for anxiety- takes BID   Probiotic Product (PROBIOTIC DAILY PO), Take by mouth. JustThrive probiotic take one daily   Reviewed prior external information including notes and imaging from  primary care provider As well as notes that were available from care everywhere and other healthcare systems.  Past medical history, social, surgical and family history all reviewed in electronic medical record.  No pertanent information unless stated regarding to the chief complaint.   Review of Systems:  No headache, visual changes, nausea, vomiting, diarrhea, constipation, dizziness, abdominal pain,  skin rash, fevers, chills, night sweats, weight loss, swollen lymph nodes, body aches, joint swelling, chest pain, shortness of breath, mood changes. POSITIVE muscle aches  Objective  There were no vitals taken for this visit.   General: No apparent distress alert and oriented x3 mood and affect normal, dressed appropriately.  HEENT: Pupils equal, extraocular movements intact  Respiratory: Patient's speak in full sentences and does not appear short of breath  Cardiovascular: No lower extremity edema, non tender, no erythema  Gait normal with good balance and coordination.  MSK:  Non tender with full range of motion and good stability and symmetric strength and tone of shoulders, elbows, wrist, hip, knee and ankles bilaterally.     Impression and Recommendations:     The above  documentation has been reviewed and is accurate and complete Wilford Grist

## 2021-09-05 ENCOUNTER — Encounter: Payer: Self-pay | Admitting: Gastroenterology

## 2021-09-05 ENCOUNTER — Ambulatory Visit (AMBULATORY_SURGERY_CENTER): Payer: BLUE CROSS/BLUE SHIELD | Admitting: *Deleted

## 2021-09-05 ENCOUNTER — Other Ambulatory Visit: Payer: Self-pay

## 2021-09-05 ENCOUNTER — Ambulatory Visit: Payer: BLUE CROSS/BLUE SHIELD | Admitting: Family Medicine

## 2021-09-05 VITALS — Ht 62.0 in | Wt 138.0 lb

## 2021-09-05 DIAGNOSIS — R1012 Left upper quadrant pain: Secondary | ICD-10-CM

## 2021-09-05 DIAGNOSIS — Z1509 Genetic susceptibility to other malignant neoplasm: Secondary | ICD-10-CM

## 2021-09-05 NOTE — Progress Notes (Signed)
Pre visit conducted over telephone.  Instructions forwarded through [facility] and email [email or web address]   No egg or soy allergy known to patient  No issues known to pt with past sedation with any surgeries or procedures Patient denies ever being told they had issues or difficulty with intubation  No FH of Malignant Hyperthermia Pt is not on diet pills Pt is not on  home 02  Pt is not on blood thinners  Pt denies issues with constipation  No A fib or A flutter  Pt is fully vaccinated  for Covid    Due to the COVID-19 pandemic we are asking patients to follow certain guidelines in [facility] and [facility]   Pt aware of COVID protocols and [facility] guidelines

## 2021-09-19 ENCOUNTER — Ambulatory Visit (INDEPENDENT_AMBULATORY_CARE_PROVIDER_SITE_OTHER): Payer: BLUE CROSS/BLUE SHIELD | Admitting: Gastroenterology

## 2021-09-19 ENCOUNTER — Encounter: Payer: Self-pay | Admitting: Gastroenterology

## 2021-09-19 VITALS — BP 123/80 | HR 70 | Temp 98.0°F | Resp 13 | Ht 62.0 in | Wt 136.0 lb

## 2021-09-19 DIAGNOSIS — K255 Chronic or unspecified gastric ulcer with perforation: Secondary | ICD-10-CM | POA: Diagnosis not present

## 2021-09-19 DIAGNOSIS — Z1509 Genetic susceptibility to other malignant neoplasm: Secondary | ICD-10-CM

## 2021-09-19 DIAGNOSIS — R1012 Left upper quadrant pain: Secondary | ICD-10-CM | POA: Diagnosis not present

## 2021-09-19 DIAGNOSIS — K259 Gastric ulcer, unspecified as acute or chronic, without hemorrhage or perforation: Secondary | ICD-10-CM | POA: Diagnosis not present

## 2021-09-19 DIAGNOSIS — R1013 Epigastric pain: Secondary | ICD-10-CM | POA: Diagnosis not present

## 2021-09-19 DIAGNOSIS — K219 Gastro-esophageal reflux disease without esophagitis: Secondary | ICD-10-CM | POA: Diagnosis not present

## 2021-09-19 HISTORY — PX: ESOPHAGOGASTRODUODENOSCOPY: SHX1529

## 2021-09-19 MED ORDER — SODIUM CHLORIDE 0.9 % IV SOLN: 500.0000 mL | Freq: Once | INTRAVENOUS | Status: DC

## 2021-09-19 MED ORDER — PANTOPRAZOLE SODIUM 40 MG PO TBEC: 40.0000 mg | DELAYED_RELEASE_TABLET | Freq: Every day | ORAL | 2 refills | Status: DC

## 2021-09-19 NOTE — Op Note (Signed)
[facility] Patient Name: Lauren Reynolds Procedure Date: 09/19/2021 10:10 AM MRN: [id number] Endoscopist: Napoleon Form , MD Age: 45 Referring MD:  Date of Birth: 08-31-1976 Gender: Female Account #: [id number] Procedure:                Upper GI endoscopy Indications:              Surveillance for malignancy secondary to Lynch                            Syndrome, Epigastric abdominal pain, Abdominal pain                            in the left upper quadrant, Hereditary nonpolyposis                            colorectal cancer (Lynch Syndrome) Medicines:                Monitored Anesthesia Care Procedure:                Pre-Anesthesia Assessment:                           - Prior to the procedure, a History and Physical                            was performed, and patient medications and                            allergies were reviewed. The patient's tolerance of                            previous anesthesia was also reviewed. The risks                            and benefits of the procedure and the sedation                            options and risks were discussed with the patient.                            All questions were answered, and informed consent                            was obtained. Prior Anticoagulants: The patient has                            taken no previous anticoagulant or antiplatelet                            agents. ASA Grade Assessment: II - A patient with                            mild systemic disease. After reviewing the risks  and benefits, the patient was deemed in                            satisfactory condition to undergo the procedure.                           After obtaining informed consent, the endoscope was                            passed under direct vision. Throughout the                            procedure, the patient's blood pressure, pulse, and                            oxygen  saturations were monitored continuously. The                            Endoscope was introduced through the mouth, and                            advanced to the second part of duodenum. The upper                            GI endoscopy was accomplished without difficulty.                            The patient tolerated the procedure well. Scope In: Scope Out: Findings:                 The Z-line was regular and was found 37 cm from the                            incisors.                           The examined esophagus was normal.                           One non-bleeding linear gastric ulcer with no                            stigmata of bleeding was found in the gastric                            antrum. The lesion was 20 mm in largest dimension.                            Biopsies were taken with a cold forceps for                            histology.                           The exam of the stomach was  otherwise normal.                            Biopsies were taken with a cold forceps for                            Helicobacter pylori testing using CLOtest.                           The examined duodenum was normal. Complications:            No immediate complications. Estimated Blood Loss:     Estimated blood loss was minimal. Impression:               - Z-line regular, 37 cm from the incisors.                           - Normal esophagus.                           - Non-bleeding gastric ulcer with no stigmata of                            bleeding. Biopsied.                           - Normal examined duodenum. Recommendation:           - Patient has a contact number available for                            emergencies. The signs and symptoms of potential                            delayed complications were discussed with the                            patient. Return to normal activities tomorrow.                            Written discharge instructions were provided to  the                            patient.                           - Resume previous diet.                           - Continue present medications.                           - Await pathology results.                           - Repeat upper endoscopy in 3 years for  surveillance.                           - No aspirin, ibuprofen, naproxen, or other                            non-steroidal anti-inflammatory drugs.                           - Use Protonix (pantoprazole) 40 mg PO daily for 3                            months. Napoleon Form, MD 09/19/2021 10:35:11 AM This report has been signed electronically.

## 2021-09-19 NOTE — Progress Notes (Addendum)
[facility] History and Physical   Primary Care Physician:  Pincus Sanes, MD   Reason for Procedure:  Lynch syndrome  Plan:    Screening EGD with possible interventions as needed     HPI: Lauren Reynolds is a very pleasant 45 y.o. female here for EGD for screening, has lynch syndrome. She has intermittent LUQ abdominal pain Denies any nausea, vomiting,  melena or bright red blood per rectum  The risks and benefits as well as alternatives of endoscopic procedure(s) have been discussed and reviewed. All questions answered. The patient agrees to proceed.    Past Medical History:  Diagnosis Date   Allergy    Anxiety    GERD (gastroesophageal reflux disease)    History of kidney stones    Hyperlipidemia     Past Surgical History:  Procedure Laterality Date   ABLATION     AUGMENTATION MAMMAPLASTY Bilateral 2016   Implants removed 2016   BREAST ENHANCEMENT SURGERY     BREAST IMPLANT REMOVAL     CESAREAN SECTION     x2   DILATION AND CURETTAGE OF UTERUS     TONSILLECTOMY      Prior to Admission medications   Medication Sig Start Date End Date Taking? Authorizing Provider  fexofenadine (ALLEGRA) 180 MG tablet Take 180 mg by mouth daily.   Yes [provider]  metroNIDAZOLE (METROCREAM) 0.75 % cream Apply topically daily. PRN 11/18/20  Yes [provider]  Cholecalciferol (VITAMIN D) 2000 units CAPS Take 1 capsule by mouth daily.    [provider]  NON FORMULARY Anxiocalm- herbal supplement for anxiety- takes BID Patient not taking: No sig reported    [provider]  Probiotic Product (PROBIOTIC DAILY PO) Take by mouth. JustThrive probiotic take one daily Patient not taking: No sig reported    [provider]    Current Outpatient Medications  Medication Sig Dispense Refill   fexofenadine (ALLEGRA) 180 MG tablet Take 180 mg by mouth daily.     metroNIDAZOLE (METROCREAM) 0.75 % cream Apply topically daily. PRN      Cholecalciferol (VITAMIN D) 2000 units CAPS Take 1 capsule by mouth daily.     NON FORMULARY Anxiocalm- herbal supplement for anxiety- takes BID (Patient not taking: No sig reported)     Probiotic Product (PROBIOTIC DAILY PO) Take by mouth. JustThrive probiotic take one daily (Patient not taking: No sig reported)     Current Facility-Administered Medications  Medication Dose Route Frequency Provider Last Rate Last Admin   0.9 %  sodium chloride infusion  500 mL Intravenous Once Napoleon Form, MD        Allergies as of 09/19/2021 - Review Complete 09/19/2021  Allergen Reaction Noted   Neomycin-bacitracin-polymyxin [bacitracin-neomycin-polymyxin] Rash 01/16/2013   Neosporin plus max st Rash 01/16/2013   Pramoxine Rash 01/16/2013   Codeine Nausea Only 01/16/2013    Family History  Problem Relation Age of Onset   Breast cancer Mother 71       triple neg   Hyperlipidemia Mother    Thyroid nodules Sister    Breast cancer Maternal Aunt    Esophageal cancer Maternal Uncle    Esophageal cancer Maternal Uncle    Breast cancer Paternal Aunt    Breast cancer Paternal Grandmother    Colon cancer Neg Hx    Stomach cancer Neg Hx    Rectal cancer Neg Hx     Social History   Socioeconomic History   Marital status: Married  Spouse name: Not on file   Number of children: 2   Years of education: Not on file   Highest education level: Not on file  Occupational History   Not on file  Tobacco Use   Smoking status: Former   Smokeless tobacco: Never   Tobacco comments:    high school  Vaping Use   Vaping Use: Never used  Substance and Sexual Activity   Alcohol use: Yes    Comment: 1 drink most nights   Drug use: Never   Sexual activity: Yes  Other Topics Concern   Not on file  Social History Narrative   Not on file   Social Determinants of Health   Financial Resource Strain: Not on file  Food Insecurity: Not on file  Transportation Needs: Not on file  Physical  Activity: Not on file  Stress: Not on file  Social Connections: Not on file  Intimate Partner Violence: Not on file    Review of Systems:  All other review of systems negative except as mentioned in the HPI.  Physical Exam: Vital signs in last 24 hours: BP 128/74   Pulse 64   Temp 98 F (36.7 C) (Temporal)   Ht 5\' 2"  (1.575 m)   Wt 136 lb (61.7 kg)   SpO2 99%   BMI 24.87 kg/m     General:   Alert, NAD Lungs:  Clear .   Heart:  Regular rate and rhythm Abdomen:  Soft, nontender and nondistended. Neuro/Psych:  Alert and cooperative. Normal mood and affect. A and O x 3  Reviewed labs, radiology imaging, old records and pertinent past GI work up  Patient is appropriate for planned procedure(s) and anesthesia in an ambulatory setting   K. , MD [phone]

## 2021-09-19 NOTE — Progress Notes (Signed)
Called to room to assist during endoscopic procedure.  Patient ID and intended procedure confirmed with present staff. Received instructions for my participation in the procedure from the performing physician.  

## 2021-09-19 NOTE — Progress Notes (Signed)
VS-JD  Pt's states no medical or surgical changes since previsit or office visit.  

## 2021-09-19 NOTE — Progress Notes (Signed)
Pt Drowsy. VSS. To PACU, report to RN. No anesthetic complications noted.  

## 2021-09-19 NOTE — Patient Instructions (Signed)
Please read handouts provided. Continue present medications. Await pathology results. Repeat upper endoscopy in 3 years. No aspirin, ibuprofen, naproxen, or other non-steriodal anti-inflammatory drugs. Use Protonix ( pantoprazole ) 40 mg daily for 3 months.   YOU HAD AN ENDOSCOPIC PROCEDURE TODAY AT THE [facility]:   Refer to the procedure report that was given to you for any specific questions about what was found during the examination.  If the procedure report does not answer your questions, please call your gastroenterologist to clarify.  If you requested that your care partner not be given the details of your procedure findings, then the procedure report has been included in a sealed envelope for you to review at your convenience later.  YOU SHOULD EXPECT: Some feelings of bloating in the abdomen. Passage of more gas than usual.  Walking can help get rid of the air that was put into your GI tract during the procedure and reduce the bloating. If you had a lower endoscopy (such as a colonoscopy or flexible sigmoidoscopy) you may notice spotting of blood in your stool or on the toilet paper. If you underwent a bowel prep for your procedure, you may not have a normal bowel movement for a few days.  Please Note:  You might notice some irritation and congestion in your nose or some drainage.  This is from the oxygen used during your procedure.  There is no need for concern and it should clear up in a day or so.  SYMPTOMS TO REPORT IMMEDIATELY:    Following upper endoscopy (EGD)  Vomiting of blood or coffee ground material  New chest pain or pain under the shoulder blades  Painful or persistently difficult swallowing  New shortness of breath  Fever of 100F or higher  Black, tarry-looking stools  For urgent or emergent issues, a gastroenterologist can be reached at any hour by calling [phone]. Do not use MyChart messaging for urgent concerns.    DIET:  We do recommend  a small meal at first, but then you may proceed to your regular diet.  Drink plenty of fluids but you should avoid alcoholic beverages for 24 hours.  ACTIVITY:  You should plan to take it easy for the rest of today and you should NOT DRIVE or use heavy machinery until tomorrow (because of the sedation medicines used during the test).    FOLLOW UP: Our staff will call the number listed on your records 48-72 hours following your procedure to check on you and address any questions or concerns that you may have regarding the information given to you following your procedure. If we do not reach you, we will leave a message.  We will attempt to reach you two times.  During this call, we will ask if you have developed any symptoms of COVID 19. If you develop any symptoms (ie: fever, flu-like symptoms, shortness of breath, cough etc.) before then, please call [phone].  If you test positive for Covid 19 in the 2 weeks post procedure, please call and report this information to [place].    If any biopsies were taken you will be contacted by phone or by letter within the next 1-3 weeks.  Please call us at [phone] if you have not heard about the biopsies in 3 weeks.    SIGNATURES/CONFIDENTIALITY: You and/or your care partner have signed paperwork which will be entered into your electronic medical record.  These signatures attest to the fact that that the information above on your  After Visit Summary has been reviewed and is understood.  Full responsibility of the confidentiality of this discharge information lies with you and/or your care-partner.

## 2021-09-20 ENCOUNTER — Telehealth: Payer: Self-pay | Admitting: *Deleted

## 2021-09-20 LAB — HELICOBACTER PYLORI SCREEN-BIOPSY: UREASE: NEGATIVE

## 2021-09-20 NOTE — Telephone Encounter (Signed)
===  View-only below this line=== ----- Message ----- From: Napoleon Form, MD Sent: 09/20/2021   2:32 PM EDT To: Marlowe Kays, CMA Subject: RE: Protonix                                   We can switch to omeprazole if that is preferred or PPI is not covered under her insurance she will have to get it OTC Omeprazole 20mg  Daily.  Thanks ----- Message ----- From: , CMA Sent: 09/19/2021   2:56 PM EDT To: 09/21/2021, MD Subject: Protonix                                       Im trying to do an authorization on this patient who had a procedure today for her protonix. Do you know if she has been on any other PPI's  I was going to check OTC omeprazole but I wasn't sure     I found in another offcie note by another providr that she has been on Omeprazole so I did prior authorization for Protonix. Waiting on response

## 2021-09-21 ENCOUNTER — Telehealth: Payer: Self-pay | Admitting: *Deleted

## 2021-09-21 NOTE — Telephone Encounter (Signed)
Patient was denied for Protonix

## 2021-09-21 NOTE — Telephone Encounter (Signed)
  Follow up Call-  Call back number 09/19/2021 06/15/2021  Post procedure Call Back phone  # [phone]  Permission to leave phone message Yes Yes  Some recent data might be hidden     Patient questions:  Do you have a fever, pain , or abdominal swelling? No. Pain Score  0 *  Have you tolerated food without any problems? Yes.    Have you been able to return to your normal activities? Yes.    Do you have any questions about your discharge instructions: Diet   No. Medications  No. Follow up visit  No.  Do you have questions or concerns about your Care? No.  Actions: * If pain score is 4 or above: No action needed, pain <4.  Have you developed a fever since your procedure? no  2.   Have you had an respiratory symptoms (SOB or cough) since your procedure? no  3.   Have you tested positive for COVID 19 since your procedure no  4.   Have you had any family members/close contacts diagnosed with the COVID 19 since your procedure?  no   If yes to any of these questions please route to Laverna Peace, RN and Karlton Lemon, RN

## 2021-09-22 NOTE — Telephone Encounter (Signed)
Left message for patient that she has to have tried and failed omeprazole and Lansoprazole. I had put she has tried omeprazole that I seen from another providers note. Asked if she wanted a script sent in for either one of those in message

## 2021-09-22 NOTE — Telephone Encounter (Signed)
Inbound call from patient. Requesting a follow up on medication request

## 2021-09-23 NOTE — Telephone Encounter (Signed)
See all notes in Patient Advise Request

## 2021-09-27 ENCOUNTER — Telehealth: Payer: Self-pay

## 2021-09-27 NOTE — Telephone Encounter (Signed)
Please switch prescription to pantoprazole 40 mg daily and advised patient to use it for 6 to 8 weeks to help heal superficial gastric ulceration..  Gastric biopsies negative for H. pylori bacterial infection or any significant abnormality.

## 2021-09-27 NOTE — Telephone Encounter (Signed)
Called the patient in response to a "patient advise request" received from her.  She reports worsening abdominal pain since starting the OTC Prilosec. She describes a "burning pain that is constant" located LUQ. She says this is a different location from her original pain. She has nausea and a headache as well. Afebrile. No vomiting. Normal daily soft bowel movements. Reports increased belching, burping and intestinal gas as well.  She is taking Prilosec 20 mg every morning. She did not take it this morning because she feels it is making her worse.

## 2021-09-27 NOTE — Telephone Encounter (Signed)
Sent patient MyChart message back, to stop the Prilosec and start taking Pantoprazole. Sent script to her pharmacy and gave instructions for taking. Also sent

## 2021-09-27 NOTE — Telephone Encounter (Signed)
Also sent results of EGD path.

## 2021-09-29 NOTE — Progress Notes (Deleted)
[facility] [address] [place] [address] Phone: [phone] Subjective:    I'm seeing this patient by the request  of:  Pincus Sanes, MD  CC:   [id number]  Lauren Reynolds is a 45 y.o. female coming in with complaint of abdominal pain left upper quadrant.  CT  09/03/21 IMPRESSION: 1. Mild long segment thickening of small-bowel loops within the left upper quadrant suggestive of a nonspecific enteritis. No evidence of bowel obstruction. No colonic inflammation or evidence of diverticulitis, as clinically questioned. 2. Bilateral nonobstructing nephrolithiasis. 3. Probable degenerating follicle within the left ovary. 4. Small volume of free fluid within the cul-de-sac, which may be physiologic/related to ruptured cyst. 5. Small fat containing umbilical hernia.  Onset-  Location Duration-  Character- Aggravating factors- Reliving factors-  Therapies tried-  Severity-     Past Medical History:  Diagnosis Date   Allergy    Anxiety    GERD (gastroesophageal reflux disease)    History of kidney stones    Hyperlipidemia    Past Surgical History:  Procedure Laterality Date   ABLATION     AUGMENTATION MAMMAPLASTY Bilateral 2016   Implants removed 2016   BREAST ENHANCEMENT SURGERY     BREAST IMPLANT REMOVAL     CESAREAN SECTION     x2   DILATION AND CURETTAGE OF UTERUS     TONSILLECTOMY     Social History   Socioeconomic History   Marital status: Married    Spouse name: Not on file   Number of children: 2   Years of education: Not on file   Highest education level: Not on file  Occupational History   Not on file  Tobacco Use   Smoking status: Former   Smokeless tobacco: Never   Tobacco comments:    high school  Vaping Use   Vaping Use: Never used  Substance and Sexual Activity   Alcohol use: Yes    Comment: 1 drink most nights   Drug use: Never   Sexual activity: Yes  Other Topics Concern   Not on file   Social History Narrative   Not on file   Social Determinants of Health   Financial Resource Strain: Not on file  Food Insecurity: Not on file  Transportation Needs: Not on file  Physical Activity: Not on file  Stress: Not on file  Social Connections: Not on file   Allergies  Allergen Reactions   Neomycin-Bacitracin-Polymyxin [Bacitracin-Neomycin-Polymyxin] Rash   Neosporin Plus Max St Rash   Pramoxine Rash   Codeine Nausea Only   Family History  Problem Relation Age of Onset   Breast cancer Mother 43       triple neg   Hyperlipidemia Mother    Thyroid nodules Sister    Breast cancer Maternal Aunt    Esophageal cancer Maternal Uncle    Esophageal cancer Maternal Uncle    Breast cancer Paternal Aunt    Breast cancer Paternal Grandmother    Colon cancer Neg Hx    Stomach cancer Neg Hx    Rectal cancer Neg Hx       Current Outpatient Medications (Respiratory):    fexofenadine (ALLEGRA) 180 MG tablet, Take 180 mg by mouth daily.    Current Outpatient Medications (Other):    Cholecalciferol (VITAMIN D) 2000 units CAPS, Take 1 capsule by mouth daily.   metroNIDAZOLE (METROCREAM) 0.75 % cream, Apply topically daily. PRN   NON FORMULARY, Anxiocalm- herbal supplement for anxiety- takes BID (  Patient not taking: No sig reported)   pantoprazole (PROTONIX) 40 MG tablet, Take 1 tablet (40 mg total) by mouth daily. Protonix 40 mg daily for 3 months.   Probiotic Product (PROBIOTIC DAILY PO), Take by mouth. JustThrive probiotic take one daily (Patient not taking: No sig reported)   Reviewed prior external information including notes and imaging from  primary care provider As well as notes that were available from care everywhere and other healthcare systems.  Past medical history, social, surgical and family history all reviewed in electronic medical record.  No pertanent information unless stated regarding to the chief complaint.   Review of Systems:  No headache, visual  changes, nausea, vomiting, diarrhea, constipation, dizziness, abdominal pain, skin rash, fevers, chills, night sweats, weight loss, swollen lymph nodes, body aches, joint swelling, chest pain, shortness of breath, mood changes. POSITIVE muscle aches  Objective  There were no vitals taken for this visit.   General: No apparent distress alert and oriented x3 mood and affect normal, dressed appropriately.  HEENT: Pupils equal, extraocular movements intact  Respiratory: Patient's speak in full sentences and does not appear short of breath  Cardiovascular: No lower extremity edema, non tender, no erythema  Gait normal with good balance and coordination.  MSK:  Non tender with full range of motion and good stability and symmetric strength and tone of shoulders, elbows, wrist, hip, knee and ankles bilaterally.     Impression and Recommendations:     The above documentation has been reviewed and is accurate and complete Doristine Bosworth

## 2021-09-30 ENCOUNTER — Other Ambulatory Visit: Payer: Self-pay | Admitting: Obstetrics and Gynecology

## 2021-09-30 DIAGNOSIS — Z9189 Other specified personal risk factors, not elsewhere classified: Secondary | ICD-10-CM

## 2021-10-03 ENCOUNTER — Ambulatory Visit: Payer: BLUE CROSS/BLUE SHIELD | Admitting: Family Medicine

## 2021-10-04 ENCOUNTER — Encounter: Payer: Self-pay | Admitting: Gastroenterology

## 2021-10-26 ENCOUNTER — Inpatient Hospital Stay: Payer: BLUE CROSS/BLUE SHIELD | Admitting: Genetic Counselor

## 2021-11-01 ENCOUNTER — Ambulatory Visit
Admission: RE | Admit: 2021-11-01 | Discharge: 2021-11-01 | Disposition: A | Discharge status: Home / Self Care | Payer: BC Managed Care – PPO | Source: Ambulatory Visit | Attending: Obstetrics and Gynecology | Admitting: Obstetrics and Gynecology

## 2021-11-01 ENCOUNTER — Other Ambulatory Visit: Payer: Self-pay

## 2021-11-01 DIAGNOSIS — N6001 Solitary cyst of right breast: Secondary | ICD-10-CM | POA: Diagnosis not present

## 2021-11-01 DIAGNOSIS — N6002 Solitary cyst of left breast: Secondary | ICD-10-CM | POA: Diagnosis not present

## 2021-11-01 DIAGNOSIS — Z9189 Other specified personal risk factors, not elsewhere classified: Secondary | ICD-10-CM

## 2021-11-01 MED ORDER — GADOBUTROL 1 MMOL/ML IV SOLN
9.0000 mL | Freq: Once | INTRAVENOUS | Status: AC | PRN
  Administered 2021-11-01: 9 mL via INTRAVENOUS

## 2021-11-02 ENCOUNTER — Encounter: Payer: Self-pay | Admitting: Genetic Counselor

## 2021-11-02 ENCOUNTER — Ambulatory Visit (HOSPITAL_BASED_OUTPATIENT_CLINIC_OR_DEPARTMENT_OTHER): Payer: BC Managed Care – PPO | Admitting: Genetic Counselor

## 2021-11-02 DIAGNOSIS — Z1509 Genetic susceptibility to other malignant neoplasm: Secondary | ICD-10-CM

## 2021-11-02 DIAGNOSIS — Z803 Family history of malignant neoplasm of breast: Secondary | ICD-10-CM

## 2021-11-02 NOTE — Progress Notes (Signed)
REFERRING PROVIDER: Pincus Sanes, MD [address] [place],  [place] [address]  PRIMARY PROVIDER:  Pincus Sanes, MD  PRIMARY REASON FOR VISIT:  1. Monoallelic mutation of MSH6 gene   2. Lynch syndrome    HISTORY OF PRESENT ILLNESS:   Lauren Reynolds, a 45 y.o. female, was seen for a [facility] cancer genetics consultation at the request of Dr. Lawerance Bach due to a MSH6 gene mutation.  I connected with Lauren Reynolds on 11/02/2021 at 11:00AM EDT by Webex video conference and verified that I am speaking with the correct person using two identifiers.   Patient location: [place], [place] Provider location: [facility]  CANCER HISTORY:  Lauren Reynolds is a 45 y.o. female with no personal history of cancer.    Past Medical History:  Diagnosis Date   Allergy    Anxiety    GERD (gastroesophageal reflux disease)    History of kidney stones    Hyperlipidemia     Past Surgical History:  Procedure Laterality Date   ABLATION     AUGMENTATION MAMMAPLASTY Bilateral 2016   Implants removed 2016   BREAST ENHANCEMENT SURGERY     BREAST IMPLANT REMOVAL     CESAREAN SECTION     x2   DILATION AND CURETTAGE OF UTERUS     TONSILLECTOMY      Social History   Socioeconomic History   Marital status: Married    Spouse name: Not on file   Number of children: 2   Years of education: Not on file   Highest education level: Not on file  Occupational History   Not on file  Tobacco Use   Smoking status: Former   Smokeless tobacco: Never   Tobacco comments:    high school  Vaping Use   Vaping Use: Never used  Substance and Sexual Activity   Alcohol use: Yes    Comment: 1 drink most nights   Drug use: Never   Sexual activity: Yes  Other Topics Concern   Not on file  Social History Narrative   Not on file   Social Determinants of Health   Financial Resource Strain: Not on file  Food Insecurity: Not on file  Transportation Needs: Not on file  Physical Activity: Not on  file  Stress: Not on file  Social Connections: Not on file     FAMILY HISTORY:  We obtained a detailed, 4-generation family history.  Significant diagnoses are listed below: Family History  Problem Relation Age of Onset   Breast cancer Mother 30       triple neg   Hyperlipidemia Mother    Thyroid nodules Sister    Breast cancer Maternal Aunt        dx. 60s   Breast cancer Maternal Aunt        dx. 73s   Esophageal cancer Maternal Uncle    Esophageal cancer Maternal Uncle    Breast cancer Paternal Grandmother        dx. 46s   Colon cancer Paternal Grandmother        dx. 59s   Stomach cancer Neg Hx    Rectal cancer Neg Hx        GENETIC COUNSELING ASSESSMENT: Lauren Reynolds is a 45 y.o. female who was found to have a MSH6 variant of uncertain significance (VUS) through [facility] that has been upgraded to a low penetrance pathogenic variant. We, therefore, discussed and recommended the following at today's visit.   DISCUSSION:  Lauren Reynolds tested positive for a single low penetrance pathogenic variant (harmful genetic change) in the MSH6 gene. Specifically, this variant is c.3226C>T.  Clinical Information Pathogenic variants in the MSH6 gene are associated with Lynch syndrome.  The cancers associated with MSH6 are: Colorectal cancer, 10-44% risk (average age of diagnosis is 84-69) Endometrial cancer, 16-49% risk (average age of diagnosis is 35-55) Ovarian cancer, up to a 13% risk (average age of diagnosis is 74) Renal pelvis and/or ureter, up to a 5.5% risk (average age of diagnosis is 80-69) Bladder cancer, up to an 8.2% risk (average age of diagnosis is 20) Gastric cancer, up to a 7.9% risk (2 cases reported at ages 70 and 85) Small bowel cancer, up to a 4% risk (average age of diagnosis is 45) Brain cancer, up to a 1.8% risk (average age of diagnosis is 69-54) Breast, prostate, pancreatic, and biliary tract cancer risk may be elevated   Management  Recommendations:  Colorectal Cancer Screening: High quality colonoscopy at age 24-35 or 2-5 years prior to the earliest colon cancer if it is diagnosed before age 73 and repeat every 1-2 years  Endometrial Cancer Screening/Risk Reduction: Women should report any abnormal uterine bleeding or postmenopausal bleeding. The evaluation of these symptoms should include an endometrial biopsy. A hysterectomy may be considered. The timing should be individualized based on whether childbearing is complete, comorbidities, and family history. Endometrial cancer screening does not have a proven benefit in women with Lynch Syndrome. However, endometrial biopsy is highly sensitive and specific as a diagnostic procedure. Screening via endometrial biopsy every 1-2 years starting at age 76-35 can be considered. Transvaginal ultrasounds may be considered in postmenopausal women at their clinician's discretion.  Ovarian Cancer Screening/Risk Reduction: A prophylactic bilateral salpingo-oophorectomy (BSO), or having the ovaries and fallopian tubes removed, may be considered. A BSO is estimated to reduce the risk of ovarian cancer by up to 96%. Timing of a BSO should be individualized based on whether childbearing is complete, menopause status, comorbidities, and family history. Women should be aware of symptoms that might be associated with the development of ovarian cancer including pelvic or abdominal pain, bloating, increased abdominal girth, difficulty eating, early satiety, or urinary frequency or urgency. Symptoms that persist for several weeks and are a change from a woman's baseline should prompt evaluation by her physician. Current data does not support routine ovarian screening for Lynch syndrome, therefore it may be considered at the clinician's discretion. Screening includes transvaginal ultrasounds and a blood test to measure CA-125 levels every 6-12 months.  Urothelial Cancer Screening: Annual urinalysis  starting at age 31-35 may be considered in selected individuals such as those with a family history of urothelial cancer (renal pelvis, ureter, and/or bladder).   Gastric and Small Bowel Cancer Screening: While there is no clear evidence to support screening for gastric, duodenal, and small bowel cancer for Lynch Syndrome, select individuals, families, or those of Asian descent may consider esophagogastroduodenoscopy (EGD) with random biopsy of the proximal and distal stomach beginning at 65 and surveillance EDG every 3-5 years.   Pancreatic Cancer Screening: Avoid smoking, heavy alcohol use, and obesity. It has been suggested that pancreatic cancer screening be limited to those with a family history of pancreatic cancer (first- or second-degree relative). Ideally, screening should be performed in experienced centers utilizing a multidisciplinary approach under research conditions. Recommended screening include annual endoscopic ultrasound (preferred) and/or MRI of the pancreas starting at age 47 or 54 years younger than the earliest age diagnosis in  the family. Annual concurrent CA19-9 testing should also be considered.  Brain Cancer Screening: Consider annual physical/neurological exams starting at age 75-30  Additional Considerations: Patients of reproductive age should be made aware of options for prenatal diagnosis and assisted reproduction including pre-implantation genetic diagnosis. Individuals with a single pathogenic MSH6 variant are also carriers of constitutional MMR deficiency (CMMRD) syndrome. CMMRD is a childhood-onset cancer predisposition syndrome that can present with hematological malignancies, cancers of the brain and central nervous system, Lynch syndrome-associated cancers (colon, uterine, small bowel, urinary tract), embryonic tumors, and sarcomas. Some affected individuals may also display cafe-au-lait macules. For there to be a risk of CMMRD in offspring, an individual and  their partner would each have to have a single pathogenic variant in the same MMR gene; in such a case, the risk of having an affected child is 25%.   This information is based on current understanding of the gene and may change in the future.  Implications for Family Members: Hereditary predisposition to cancer due to pathogenic variants in the MSH6 gene has autosomal dominant inheritance. This means that an individual with a pathogenic variant has a 50% chance of passing the condition on to his/her offspring. Most cases are inherited from a parent, but some cases may occur spontaneously (i.e., an individual with a pathogenic variant has parents who do not have it). Identification of a pathogenic variant allows for the recognition of at-risk relatives who can pursue testing for the familial variant.  Family members are encouraged to consider genetic testing for this familial pathogenic variant. As there are generally no childhood cancer risks associated with pathogenic variants in the MSH6 gene, individuals in the family are not recommended to have testing until they reach at least 47 years of age. They may contact our office at [phone]) for more information or to schedule an appointment. Family members who live outside of the area are encouraged to find a genetic counselor in their area by visiting: [email or web address].  Resources: FORCE (Facing Our Risk of Cancer Empowered) is a resource for those with a hereditary predisposition to develop cancer.  FORCE provides information about risk reduction, advocacy, legislation, and clinical trials.  Additionally, [employer] provides a platform for collaboration and support which includes: peer navigation, message boards, local support groups, a toll-free helpline, research registry and recruitment, advocate training, published medical research, webinars, brochures, mastectomy photos, and more.  For more information, visit  [email or web address] Lynch Syndrome [employer]- www.lynchcancers.com  PLAN:  Lauren Reynolds has a TAH/BSO scheduled for January 2023. Her most recent colonoscopy was 06/15/2021. Her most recent endoscopy was 09/19/2021. She will be establishing with Dr. Louis Meckel at [facility]. Her PCP, Dr. Quay Burow, will be placing a referral for a new gastroenterologist who specializes in Lynch Syndrome. She is currently having breast MRIs due to dense breasts.  She will reach out to genetics every 1-2 years to see if there are any updated guidelines for MSH6.  Lauren Reynolds questions were answered to her satisfaction today. Our contact information was provided should additional questions or concerns arise. Thank you for the referral and allowing [place] to share in the care of your patient.   Lucille Passy, MS, [facility] Genetic Counselor [email or web address] (P) [phone]  The patient was seen for a total of 50 minutes in video genetic counseling. The patient was seen alone.  Drs. Magrinat, Lindi Adie and/or Burr Medico were available to discuss this case as needed.  _______________________________________________________________________ For Office Staff:  Number of people involved  in session: 1 Was an Intern/ student involved with case: no

## 2021-11-03 ENCOUNTER — Encounter: Payer: Self-pay | Admitting: Genetic Counselor

## 2021-11-18 DIAGNOSIS — M545 Low back pain, unspecified: Secondary | ICD-10-CM | POA: Diagnosis not present

## 2021-11-18 DIAGNOSIS — M546 Pain in thoracic spine: Secondary | ICD-10-CM | POA: Diagnosis not present

## 2021-11-23 ENCOUNTER — Other Ambulatory Visit: Payer: Self-pay | Admitting: Urology

## 2021-11-23 DIAGNOSIS — M546 Pain in thoracic spine: Secondary | ICD-10-CM

## 2021-11-23 DIAGNOSIS — M545 Low back pain, unspecified: Secondary | ICD-10-CM

## 2021-12-05 DIAGNOSIS — Z1502 Genetic susceptibility to malignant neoplasm of ovary: Secondary | ICD-10-CM | POA: Diagnosis not present

## 2021-12-07 ENCOUNTER — Encounter (HOSPITAL_BASED_OUTPATIENT_CLINIC_OR_DEPARTMENT_OTHER): Payer: Self-pay | Admitting: Obstetrics and Gynecology

## 2021-12-07 DIAGNOSIS — Z01812 Encounter for preprocedural laboratory examination: Secondary | ICD-10-CM | POA: Diagnosis present

## 2021-12-07 NOTE — H&P (Addendum)
Lauren Reynolds is an 46 y.o. 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  No pelvic pain or abnormal bleeding.    Menstrual History: No LMP recorded. Patient has had an ablation.    Past Medical History:  Diagnosis Date   Allergy    Anxiety    GERD (gastroesophageal reflux disease)    History of kidney stones    Hyperlipidemia     Past Surgical History:  Procedure Laterality Date   ABLATION     AUGMENTATION MAMMAPLASTY Bilateral 2016   Implants removed 2016   BREAST ENHANCEMENT SURGERY     BREAST IMPLANT REMOVAL     CESAREAN SECTION     x2   DILATION AND CURETTAGE OF UTERUS     TONSILLECTOMY      Family History  Problem Relation Age of Onset   Breast cancer Mother 3       triple neg   Hyperlipidemia Mother    Thyroid nodules Sister    Breast cancer Maternal Aunt        dx. 62s   Breast cancer Maternal Aunt        dx. 20s   Esophageal cancer Maternal Uncle    Esophageal cancer Maternal Uncle    Prostate cancer Paternal Uncle    Breast cancer Paternal Grandmother        dx. 45s   Colon cancer Paternal Grandmother        dx. 67s   Stomach cancer Neg Hx    Rectal cancer Neg Hx     Social History:  reports that she has quit smoking. She has never used smokeless tobacco. She reports current alcohol use. She reports that she does not use drugs.  Allergies:  Allergies  Allergen Reactions   Neomycin-Bacitracin-Polymyxin [Bacitracin-Neomycin-Polymyxin] Rash   Neosporin Plus Max St Rash   Pramoxine Rash   Codeine Nausea Only    Meds:  pantoprazole, meloxicam prn  Review of Systems:  negative  AF, VSS Physical Exam Gen - NAD CV - RRR Lungs - clear Abd - soft, NT PV - uterus NT, no adnexal mass  PV [place]:  normal uterus and adnexa. Resolving cyst on right ovary.  Small amount FF in CDS.  CA125 - 13  Assessment/Plan:  Lynch Syndrome LAVH/BSO with possible TAH/BSO R/b/a discussed Questions answered Informed consent  Lauren Reynolds 12/07/2021, 8:26 AM

## 2021-12-08 ENCOUNTER — Other Ambulatory Visit: Payer: Self-pay

## 2021-12-08 ENCOUNTER — Encounter (HOSPITAL_BASED_OUTPATIENT_CLINIC_OR_DEPARTMENT_OTHER): Payer: Self-pay | Admitting: Obstetrics and Gynecology

## 2021-12-08 NOTE — Progress Notes (Signed)
Spoke w/ via phone for pre-op interview---pt Lab needs dos----  urine preg poct             Lab results------lab appt 12-13-2021 1400 for cbc t & s COVID test -----patient states asymptomatic no test needed Arrive at -------530 am 12-15-2021 NPO after MN NO Solid Food.  Clear liquids from MN until---430 am Med rec completed Medications to take morning of surgery -----Omeprazole Diabetic medication -----n/a Patient instructed no nail polish to be worn day of surgery Patient instructed to bring photo id and insurance card day of surgery Patient aware to have Driver (ride ) / caregiver    for 24 hours after surgery  husband Lauren Reynolds Patient Special Instructions -----pt given overnight stay instructions Pre-Op special Istructions -----none Patient verbalized understanding of instructions that were given at this phone interview. Patient denies shortness of breath, chest pain, fever, cough at this phone interview.

## 2021-12-08 NOTE — Progress Notes (Signed)
PLEASE WEAR A MASK OUT IN PUBLIC AND SOCIAL DISTANCE AND [facility] YOUR HANDS FREQUENTLY. PLEASE ASK ALL YOUR CLOSE HOUSEHOLD CONTACT TO WEAR MASK OUT IN PUBLIC AND SOCIAL DISTANCE AND [place] HANDS FREQUENTLY ALSO.      Your procedure is scheduled on 530 AM1-19-2023  Report to [facility].   Call this number if you have problems the morning of surgery  :[phone].   OUR ADDRESS IS [address].  WE ARE LOCATED IN [facility].  PLEASE BRING YOUR INSURANCE CARD AND PHOTO ID DAY OF SURGERY.  ONLY ONE PERSON ALLOWED IN FACILITY WAITING AREA.                                     REMEMBER:  DO NOT EAT FOOD, CANDY GUM OR MINTS  AFTER MIDNIGHT THE NIGHT BEFORE YOUR SURGERY . YOU MAY HAVE CLEAR LIQUIDS FROM MIDNIGHT THE NIGHT BEFORE YOUR SURGERY UNTIL  430 AM.  NO CLEAR LIQUIDS AFTER 430 AM DAY OF SURGERY.   YOU MAY  BRUSH YOUR TEETH MORNING OF SURGERY AND RINSE YOUR MOUTH OUT, NO CHEWING GUM CANDY OR MINTS.    CLEAR LIQUID DIET   Foods Allowed                                                                     Foods Excluded  Coffee and tea, regular and decaf                             liquids that you cannot  Plain Jell-O any favor except red or purple                                           see through such as: Fruit ices (not with fruit pulp)                                     milk, soups, orange juice  Iced Popsicles                                    All solid food Carbonated beverages, regular and diet                                    Cranberry, grape and apple juices Sports drinks like Gatorade  Sample Menu Breakfast                                Lunch  Supper Cranberry juice                                           Jell-O                                     Grape juice                           Apple juice Coffee or tea                        Jell-O                                       Popsicle                                                Coffee or tea                        Coffee or tea  _____________________________________________________________________     TAKE THESE MEDICATIONS MORNING OF SURGERY WITH A SIP OF WATER:  OMEPRAZOLE.  ONE VISITOR IS ALLOWED IN WAITING ROOM ONLY DAY OF SURGERY.  YOU MAY HAVE ANOTHER PERSON SWITCH OUT WITH THE  1  VISITOR IN THE WAITING ROOM DAY OF SURGERY AND A MASK MUST BE WORN IN THE WAITING ROOM.    2 VISITORS  MAY VISIT IN THE EXTENDED RECOVERY ROOM UNTIL 800 PM ONLY 1 VISITOR AGE 70 AND OVER MAY SPEND THE NIGHT AND MUST BE IN EXTENDED RECOVERY ROOM NO LATER THAN 800 PM .     ALL PERSONS VISITING IN EXTENDED RECOVERY ROOM MUST WEAR A MASK.                                    DO NOT WEAR JEWERLY, MAKE UP. DO NOT WEAR LOTIONS, POWDERS, PERFUMES OR NAIL POLISH ON YOUR FINGERNAILS. TOENAIL POLISH IS OK TO WEAR. DO NOT SHAVE FOR 48 HOURS PRIOR TO DAY OF SURGERY. MEN MAY SHAVE FACE AND NECK. CONTACTS, GLASSES, OR DENTURES MAY NOT BE WORN TO SURGERY.                                    [facility] IS NOT RESPONSIBLE  FOR ANY BELONGINGS.                                                                    Marland Kitchen           [facility] - Preparing for Surgery Before surgery, you can play an important role.  Because skin is not sterile, your  skin needs to be as free of germs as possible.  You can reduce the number of germs on your skin by washing with CHG (chlorahexidine gluconate) soap before surgery.  CHG is an antiseptic cleaner which kills germs and bonds with the skin to continue killing germs even after washing. Please DO NOT use if you have an allergy to CHG or antibacterial soaps.  If your skin becomes reddened/irritated stop using the CHG and inform your nurse when you arrive at Short Stay. Do not shave (including legs and underarms) for at least 48 hours prior to the first CHG shower.  You may shave your face/neck. Please follow these  instructions carefully:  1.  Shower with CHG Soap the night before surgery and the  morning of Surgery.  2.  If you choose to wash your hair, wash your hair first as usual with your  normal  shampoo.  3.  After you shampoo, rinse your hair and body thoroughly to remove the  shampoo.                            4.  Use CHG as you would any other liquid soap.  You can apply chg directly  to the skin and wash                      Gently with a scrungie or clean washcloth.  5.  Apply the CHG Soap to your body ONLY FROM THE NECK DOWN.   Do not use on face/ open                           Wound or open sores. Avoid contact with eyes, ears mouth and genitals (private parts).                       Wash face,  Genitals (private parts) with your normal soap.             6.  Wash thoroughly, paying special attention to the area where your surgery  will be performed.  7.  Thoroughly rinse your body with warm water from the neck down.  8.  DO NOT shower/wash with your normal soap after using and rinsing off  the CHG Soap.                9.  Pat yourself dry with a clean towel.            10.  Wear clean pajamas.            11.  Place clean sheets on your bed the night of your first shower and do not  sleep with pets. Day of Surgery : Do not apply any lotions/deodorants the morning of surgery.  Please wear clean clothes to the hospital/surgery center.  IF YOU HAVE ANY SKIN IRRITATION OR PROBLEMS WITH THE SURGICAL SOAP, PLEASE GET A BAR OF GOLD DIAL SOAP AND SHOWER THE NIGHT BEFORE YOUR SURGERY AND THE MORNING OF YOUR SURGERY. PLEASE LET THE NURSE KNOW MORNING OF YOUR SURGERY IF YOU HAD ANY PROBLEMS WITH THE SURGICAL SOAP.  FAILURE TO FOLLOW THESE INSTRUCTIONS MAY RESULT IN THE CANCELLATION OF YOUR SURGERY PATIENT SIGNATURE_________________________________  NURSE SIGNATURE__________________________________  ________________________________________________________________________  QUESTIONS CALL Marolyn Urschel PRE OP NURSE PHONE [phone].

## 2021-12-13 ENCOUNTER — Encounter (HOSPITAL_COMMUNITY)
Admission: RE | Admit: 2021-12-13 | Discharge: 2021-12-13 | Disposition: A | Discharge status: Home / Self Care | Payer: BC Managed Care – PPO | Source: Ambulatory Visit | Attending: Obstetrics and Gynecology | Admitting: Obstetrics and Gynecology

## 2021-12-13 ENCOUNTER — Other Ambulatory Visit: Payer: Self-pay

## 2021-12-13 DIAGNOSIS — Z01812 Encounter for preprocedural laboratory examination: Secondary | ICD-10-CM | POA: Insufficient documentation

## 2021-12-13 DIAGNOSIS — Z800 Family history of malignant neoplasm of digestive organs: Secondary | ICD-10-CM | POA: Diagnosis not present

## 2021-12-13 DIAGNOSIS — Z4009 Encounter for prophylactic removal of other organ: Secondary | ICD-10-CM | POA: Diagnosis not present

## 2021-12-13 DIAGNOSIS — Z1509 Genetic susceptibility to other malignant neoplasm: Secondary | ICD-10-CM | POA: Diagnosis not present

## 2021-12-13 DIAGNOSIS — Z4002 Encounter for prophylactic removal of ovary: Secondary | ICD-10-CM | POA: Diagnosis not present

## 2021-12-13 DIAGNOSIS — Z803 Family history of malignant neoplasm of breast: Secondary | ICD-10-CM | POA: Diagnosis not present

## 2021-12-13 DIAGNOSIS — Z1502 Genetic susceptibility to malignant neoplasm of ovary: Secondary | ICD-10-CM | POA: Diagnosis not present

## 2021-12-13 DIAGNOSIS — Z87891 Personal history of nicotine dependence: Secondary | ICD-10-CM | POA: Diagnosis not present

## 2021-12-13 LAB — CBC
HCT: 37.2 % (ref 36.0–46.0)
Hemoglobin: 12.6 g/dL (ref 12.0–15.0)
MCH: 30 pg (ref 26.0–34.0)
MCHC: 33.9 g/dL (ref 30.0–36.0)
MCV: 88.6 fL (ref 80.0–100.0)
Platelets: 288 10*3/uL (ref 150–400)
RBC: 4.2 MIL/uL (ref 3.87–5.11)
RDW: 12.2 % (ref 11.5–15.5)
WBC: 5.4 10*3/uL (ref 4.0–10.5)
nRBC: 0 % (ref 0.0–0.2)

## 2021-12-14 ENCOUNTER — Encounter (HOSPITAL_COMMUNITY): Payer: Self-pay

## 2021-12-14 ENCOUNTER — Ambulatory Visit (HOSPITAL_COMMUNITY): Admission: RE | Admit: 2021-12-14 | Payer: BC Managed Care – PPO | Source: Ambulatory Visit

## 2021-12-15 ENCOUNTER — Ambulatory Visit (HOSPITAL_BASED_OUTPATIENT_CLINIC_OR_DEPARTMENT_OTHER): Payer: BC Managed Care – PPO | Admitting: Anesthesiology

## 2021-12-15 ENCOUNTER — Observation Stay (HOSPITAL_BASED_OUTPATIENT_CLINIC_OR_DEPARTMENT_OTHER)
Admission: RE | Admit: 2021-12-15 | Discharge: 2021-12-16 | Disposition: A | Discharge status: Home / Self Care | Payer: BC Managed Care – PPO | Attending: Obstetrics and Gynecology | Admitting: Obstetrics and Gynecology

## 2021-12-15 ENCOUNTER — Encounter (HOSPITAL_BASED_OUTPATIENT_CLINIC_OR_DEPARTMENT_OTHER)
Admission: RE | Disposition: A | Discharge status: Home / Self Care | Payer: Self-pay | Source: Home / Self Care | Attending: Obstetrics and Gynecology

## 2021-12-15 ENCOUNTER — Other Ambulatory Visit: Payer: Self-pay

## 2021-12-15 ENCOUNTER — Encounter (HOSPITAL_BASED_OUTPATIENT_CLINIC_OR_DEPARTMENT_OTHER): Payer: Self-pay | Admitting: Obstetrics and Gynecology

## 2021-12-15 DIAGNOSIS — Z1502 Genetic susceptibility to malignant neoplasm of ovary: Secondary | ICD-10-CM | POA: Diagnosis not present

## 2021-12-15 DIAGNOSIS — Z148 Genetic carrier of other disease: Secondary | ICD-10-CM

## 2021-12-15 DIAGNOSIS — Z803 Family history of malignant neoplasm of breast: Secondary | ICD-10-CM | POA: Diagnosis not present

## 2021-12-15 DIAGNOSIS — Z87891 Personal history of nicotine dependence: Secondary | ICD-10-CM | POA: Diagnosis not present

## 2021-12-15 DIAGNOSIS — Z4009 Encounter for prophylactic removal of other organ: Secondary | ICD-10-CM | POA: Diagnosis not present

## 2021-12-15 DIAGNOSIS — Z800 Family history of malignant neoplasm of digestive organs: Secondary | ICD-10-CM | POA: Insufficient documentation

## 2021-12-15 DIAGNOSIS — Z1506 Genetic susceptibility to malignant neoplasm of urinary tract: Secondary | ICD-10-CM

## 2021-12-15 DIAGNOSIS — Z4002 Encounter for prophylactic removal of ovary: Principal | ICD-10-CM | POA: Insufficient documentation

## 2021-12-15 DIAGNOSIS — Z1509 Genetic susceptibility to other malignant neoplasm: Secondary | ICD-10-CM | POA: Insufficient documentation

## 2021-12-15 HISTORY — DX: Presence of spectacles and contact lenses: Z97.3

## 2021-12-15 HISTORY — DX: Genetic susceptibility to malignant neoplasm of urinary tract: Z15.07

## 2021-12-15 HISTORY — PX: LAPAROSCOPIC VAGINAL HYSTERECTOMY WITH SALPINGO OOPHORECTOMY: SHX6681

## 2021-12-15 HISTORY — DX: Genetic susceptibility to other malignant neoplasm: Z15.09

## 2021-12-15 LAB — TYPE AND SCREEN
ABO/RH(D): A POS
Antibody Screen: NEGATIVE

## 2021-12-15 LAB — POCT PREGNANCY, URINE: Preg Test, Ur: NEGATIVE

## 2021-12-15 SURGERY — HYSTERECTOMY, VAGINAL, LAPAROSCOPY-ASSISTED, WITH SALPINGO-OOPHORECTOMY
Anesthesia: General | Site: Abdomen | Laterality: Bilateral

## 2021-12-15 MED ORDER — EPHEDRINE 5 MG/ML INJ
INTRAVENOUS | Status: AC
  Filled 2021-12-15: qty 5

## 2021-12-15 MED ORDER — HYDROMORPHONE HCL 1 MG/ML IJ SOLN
0.2500 mg | INTRAMUSCULAR | Status: DC | PRN
  Administered 2021-12-15 (×3): 0.5 mg via INTRAVENOUS

## 2021-12-15 MED ORDER — OXYCODONE-ACETAMINOPHEN 5-325 MG PO TABS
ORAL_TABLET | ORAL | Status: AC
  Filled 2021-12-15: qty 2

## 2021-12-15 MED ORDER — ONDANSETRON HCL 4 MG/2ML IJ SOLN
INTRAMUSCULAR | Status: AC
  Filled 2021-12-15: qty 2

## 2021-12-15 MED ORDER — DEXAMETHASONE SODIUM PHOSPHATE 10 MG/ML IJ SOLN
INTRAMUSCULAR | Status: AC
  Filled 2021-12-15: qty 1

## 2021-12-15 MED ORDER — ONDANSETRON HCL 4 MG PO TABS: 4.0000 mg | ORAL_TABLET | Freq: Four times a day (QID) | ORAL | Status: DC | PRN

## 2021-12-15 MED ORDER — ONDANSETRON HCL 4 MG/2ML IJ SOLN
INTRAMUSCULAR | Status: DC | PRN
  Administered 2021-12-15: 4 mg via INTRAVENOUS

## 2021-12-15 MED ORDER — ACETAMINOPHEN 500 MG PO TABS: 1000.0000 mg | ORAL_TABLET | Freq: Once | ORAL | Status: DC

## 2021-12-15 MED ORDER — DOCUSATE SODIUM 100 MG PO CAPS
ORAL_CAPSULE | ORAL | Status: AC
  Filled 2021-12-15: qty 1

## 2021-12-15 MED ORDER — FENTANYL CITRATE (PF) 100 MCG/2ML IJ SOLN
INTRAMUSCULAR | Status: AC
  Filled 2021-12-15: qty 2

## 2021-12-15 MED ORDER — OXYCODONE HCL 5 MG PO TABS
5.0000 mg | ORAL_TABLET | Freq: Once | ORAL | Status: AC | PRN
  Administered 2021-12-15: 5 mg via ORAL

## 2021-12-15 MED ORDER — MENTHOL 3 MG MT LOZG: 1.0000 | LOZENGE | OROMUCOSAL | Status: DC | PRN

## 2021-12-15 MED ORDER — KETOROLAC TROMETHAMINE 30 MG/ML IJ SOLN
INTRAMUSCULAR | Status: DC | PRN
Start: 2021-12-15 — End: 2021-12-15
  Administered 2021-12-15: 30 mg via INTRAVENOUS

## 2021-12-15 MED ORDER — PROPOFOL 10 MG/ML IV BOLUS
INTRAVENOUS | Status: AC
  Filled 2021-12-15: qty 20

## 2021-12-15 MED ORDER — DOCUSATE SODIUM 100 MG PO CAPS
100.0000 mg | ORAL_CAPSULE | Freq: Two times a day (BID) | ORAL | Status: DC
  Administered 2021-12-15 (×2): 100 mg via ORAL

## 2021-12-15 MED ORDER — MIDAZOLAM HCL 5 MG/5ML IJ SOLN
INTRAMUSCULAR | Status: DC | PRN
  Administered 2021-12-15: 2 mg via INTRAVENOUS

## 2021-12-15 MED ORDER — LIDOCAINE HCL (CARDIAC) PF 100 MG/5ML IV SOSY
PREFILLED_SYRINGE | INTRAVENOUS | Status: DC | PRN
  Administered 2021-12-15: 60 mg via INTRAVENOUS
  Administered 2021-12-15: 10 mg via INTRAVENOUS

## 2021-12-15 MED ORDER — ACETAMINOPHEN 10 MG/ML IV SOLN
INTRAVENOUS | Status: AC
  Filled 2021-12-15: qty 100

## 2021-12-15 MED ORDER — MIDAZOLAM HCL 2 MG/2ML IJ SOLN
INTRAMUSCULAR | Status: AC
  Filled 2021-12-15: qty 2

## 2021-12-15 MED ORDER — ONDANSETRON HCL 4 MG/2ML IJ SOLN
4.0000 mg | Freq: Four times a day (QID) | INTRAMUSCULAR | Status: DC | PRN
  Administered 2021-12-15: 4 mg via INTRAVENOUS

## 2021-12-15 MED ORDER — SODIUM CHLORIDE 0.9 % IR SOLN
Status: DC | PRN
  Administered 2021-12-15: 300 mL

## 2021-12-15 MED ORDER — PHENYLEPHRINE 40 MCG/ML (10ML) SYRINGE FOR IV PUSH (FOR BLOOD PRESSURE SUPPORT)
PREFILLED_SYRINGE | INTRAVENOUS | Status: AC
  Filled 2021-12-15: qty 10

## 2021-12-15 MED ORDER — PROPOFOL 10 MG/ML IV BOLUS
INTRAVENOUS | Status: DC | PRN
  Administered 2021-12-15: 150 mg via INTRAVENOUS

## 2021-12-15 MED ORDER — ROCURONIUM BROMIDE 10 MG/ML (PF) SYRINGE
PREFILLED_SYRINGE | INTRAVENOUS | Status: AC
  Filled 2021-12-15: qty 10

## 2021-12-15 MED ORDER — FAMOTIDINE 20 MG PO TABS
ORAL_TABLET | ORAL | Status: AC
  Filled 2021-12-15: qty 1

## 2021-12-15 MED ORDER — KETOROLAC TROMETHAMINE 30 MG/ML IJ SOLN: 30.0000 mg | Freq: Once | INTRAMUSCULAR | Status: AC | PRN

## 2021-12-15 MED ORDER — HYDROMORPHONE HCL 1 MG/ML IJ SOLN: 0.2000 mg | INTRAMUSCULAR | Status: DC | PRN

## 2021-12-15 MED ORDER — SODIUM CHLORIDE 0.9 % IV SOLN
2.0000 g | INTRAVENOUS | Status: AC
  Administered 2021-12-15: 2 g via INTRAVENOUS

## 2021-12-15 MED ORDER — LACTATED RINGERS IV SOLN: INTRAVENOUS | Status: DC

## 2021-12-15 MED ORDER — OXYCODONE-ACETAMINOPHEN 5-325 MG PO TABS
1.0000 | ORAL_TABLET | ORAL | Status: DC | PRN
  Administered 2021-12-15 – 2021-12-16 (×5): 2 via ORAL

## 2021-12-15 MED ORDER — ROCURONIUM BROMIDE 100 MG/10ML IV SOLN
INTRAVENOUS | Status: DC | PRN
  Administered 2021-12-15: 70 mg via INTRAVENOUS

## 2021-12-15 MED ORDER — OXYCODONE HCL 5 MG PO TABS
ORAL_TABLET | ORAL | Status: AC
  Filled 2021-12-15: qty 1

## 2021-12-15 MED ORDER — FENTANYL CITRATE (PF) 100 MCG/2ML IJ SOLN
INTRAMUSCULAR | Status: DC | PRN
  Administered 2021-12-15 (×4): 50 ug via INTRAVENOUS

## 2021-12-15 MED ORDER — SUGAMMADEX SODIUM 200 MG/2ML IV SOLN
INTRAVENOUS | Status: DC | PRN
  Administered 2021-12-15: 200 mg via INTRAVENOUS

## 2021-12-15 MED ORDER — HYDROMORPHONE HCL 1 MG/ML IJ SOLN
INTRAMUSCULAR | Status: AC
  Filled 2021-12-15: qty 1

## 2021-12-15 MED ORDER — HYDROMORPHONE HCL 1 MG/ML IJ SOLN
1.0000 mg | Freq: Once | INTRAMUSCULAR | Status: AC
  Administered 2021-12-15: 1 mg via INTRAVENOUS

## 2021-12-15 MED ORDER — EPHEDRINE SULFATE (PRESSORS) 50 MG/ML IJ SOLN
INTRAMUSCULAR | Status: DC | PRN
  Administered 2021-12-15: 10 mg via INTRAVENOUS

## 2021-12-15 MED ORDER — LACTATED RINGERS IV SOLN
INTRAVENOUS | Status: DC
  Administered 2021-12-15: 1 mL via INTRAVENOUS

## 2021-12-15 MED ORDER — SIMETHICONE 80 MG PO CHEW: 80.0000 mg | CHEWABLE_TABLET | Freq: Four times a day (QID) | ORAL | Status: DC | PRN

## 2021-12-15 MED ORDER — BUPIVACAINE HCL (PF) 0.25 % IJ SOLN
INTRAMUSCULAR | Status: DC | PRN
  Administered 2021-12-15: 5 mL

## 2021-12-15 MED ORDER — AMISULPRIDE (ANTIEMETIC) 5 MG/2ML IV SOLN
10.0000 mg | Freq: Once | INTRAVENOUS | Status: AC
  Administered 2021-12-15: 10 mg via INTRAVENOUS

## 2021-12-15 MED ORDER — PROMETHAZINE HCL 25 MG/ML IJ SOLN: 6.2500 mg | INTRAMUSCULAR | Status: DC | PRN

## 2021-12-15 MED ORDER — ACETAMINOPHEN 10 MG/ML IV SOLN
INTRAVENOUS | Status: DC | PRN
  Administered 2021-12-15: 1000 mg via INTRAVENOUS

## 2021-12-15 MED ORDER — AMISULPRIDE (ANTIEMETIC) 5 MG/2ML IV SOLN
INTRAVENOUS | Status: AC
  Filled 2021-12-15: qty 4

## 2021-12-15 MED ORDER — MEPERIDINE HCL 25 MG/ML IJ SOLN: 6.2500 mg | INTRAMUSCULAR | Status: DC | PRN

## 2021-12-15 MED ORDER — FAMOTIDINE 20 MG PO TABS: 20.0000 mg | ORAL_TABLET | Freq: Two times a day (BID) | ORAL | Status: DC

## 2021-12-15 MED ORDER — DEXAMETHASONE SODIUM PHOSPHATE 4 MG/ML IJ SOLN
INTRAMUSCULAR | Status: DC | PRN
  Administered 2021-12-15: 10 mg via INTRAVENOUS

## 2021-12-15 MED ORDER — SODIUM CHLORIDE 0.9 % IV SOLN
INTRAVENOUS | Status: AC
  Filled 2021-12-15: qty 2

## 2021-12-15 MED ORDER — 0.9 % SODIUM CHLORIDE (POUR BTL) OPTIME
TOPICAL | Status: DC | PRN
  Administered 2021-12-15: 500 mL

## 2021-12-15 MED ORDER — POVIDONE-IODINE 10 % EX SWAB: 2.0000 "application " | Freq: Once | CUTANEOUS | Status: DC

## 2021-12-15 MED ORDER — OXYCODONE HCL 5 MG/5ML PO SOLN: 5.0000 mg | Freq: Once | ORAL | Status: AC | PRN

## 2021-12-15 MED ORDER — PHENYLEPHRINE HCL (PRESSORS) 10 MG/ML IV SOLN
INTRAVENOUS | Status: DC | PRN
  Administered 2021-12-15: 40 ug via INTRAVENOUS
  Administered 2021-12-15: 80 ug via INTRAVENOUS

## 2021-12-15 MED ORDER — FENTANYL CITRATE (PF) 250 MCG/5ML IJ SOLN
INTRAMUSCULAR | Status: AC
  Filled 2021-12-15: qty 5

## 2021-12-15 MED ORDER — IBUPROFEN 200 MG PO TABS: 600.0000 mg | ORAL_TABLET | Freq: Four times a day (QID) | ORAL | Status: DC | PRN

## 2021-12-15 SURGICAL SUPPLY — 62 items
ADH SKN CLS APL DERMABOND .7 (GAUZE/BANDAGES/DRESSINGS) ×1
APL SWBSTK 6 STRL LF DISP (MISCELLANEOUS) ×2
APPLICATOR COTTON TIP 6 STRL (MISCELLANEOUS) IMPLANT
APPLICATOR COTTON TIP 6IN STRL (MISCELLANEOUS) ×4
BAG RETRIEVAL 10 (BASKET)
BARRIER ADHS 3X4 INTERCEED (GAUZE/BANDAGES/DRESSINGS) IMPLANT
BLADE CLIPPER SENSICLIP SURGIC (BLADE) IMPLANT
BRR ADH 4X3 ABS CNTRL BYND (GAUZE/BANDAGES/DRESSINGS)
COVER BACK TABLE 60X90IN (DRAPES) ×2 IMPLANT
DECANTER SPIKE VIAL GLASS SM (MISCELLANEOUS) ×1 IMPLANT
DERMABOND ADVANCED (GAUZE/BANDAGES/DRESSINGS) ×1
DERMABOND ADVANCED .7 DNX12 (GAUZE/BANDAGES/DRESSINGS) ×1 IMPLANT
DRSG OPSITE POSTOP 3X4 (GAUZE/BANDAGES/DRESSINGS) ×2 IMPLANT
DRSG TEGADERM 2-3/8X2-3/4 SM (GAUZE/BANDAGES/DRESSINGS) IMPLANT
DURAPREP 26ML APPLICATOR (WOUND CARE) ×2 IMPLANT
ELECT REM PT RETURN 9FT ADLT (ELECTROSURGICAL) ×2
ELECTRODE REM PT RTRN 9FT ADLT (ELECTROSURGICAL) ×1 IMPLANT
GAUZE 4X4 16PLY ~~LOC~~+RFID DBL (SPONGE) ×6 IMPLANT
GAUZE SPONGE 4X4 12PLY STRL (GAUZE/BANDAGES/DRESSINGS) ×1 IMPLANT
GLOVE SURG ENC MOIS LTX SZ6.5 (GLOVE) ×6 IMPLANT
GLOVE SURG LTX SZ6.5 (GLOVE) ×2 IMPLANT
GLOVE SURG UNDER POLY LF SZ7 (GLOVE) ×5 IMPLANT
GOWN STRL REUS W/TWL LRG LVL3 (GOWN DISPOSABLE) ×4 IMPLANT
HOLDER FOLEY CATH W/STRAP (MISCELLANEOUS) ×2 IMPLANT
KIT TURNOVER CYSTO (KITS) ×2 IMPLANT
NS IRRIG 500ML POUR BTL (IV SOLUTION) ×2 IMPLANT
PACK LAVH (CUSTOM PROCEDURE TRAY) ×2 IMPLANT
PACK TRENDGUARD 450 HYBRID PRO (MISCELLANEOUS) IMPLANT
PAD OB MATERNITY 4.3X12.25 (PERSONAL CARE ITEMS) ×2 IMPLANT
PAD PREP 24X48 CUFFED NSTRL (MISCELLANEOUS) ×2 IMPLANT
SCISSORS LAP 5X35 DISP (ENDOMECHANICALS) IMPLANT
SEALER TISSUE G2 CVD JAW 45CM (ENDOMECHANICALS) ×3 IMPLANT
SET IRRIG Y TYPE TUR BLADDER L (SET/KITS/TRAYS/PACK) IMPLANT
SET SUCTION IRRIG HYDROSURG (IRRIGATION / IRRIGATOR) IMPLANT
SET TUBE SMOKE EVAC HIGH FLOW (TUBING) ×2 IMPLANT
SHEET LAVH (DRAPES) ×1 IMPLANT
SPONGE T-LAP 4X18 ~~LOC~~+RFID (SPONGE) ×2 IMPLANT
STRIP CLOSURE SKIN 1/4X4 (GAUZE/BANDAGES/DRESSINGS) IMPLANT
SUT MNCRL 0 1X36 CT-1 (SUTURE) ×1 IMPLANT
SUT MNCRL 0 MO-4 VIOLET 18 CR (SUTURE) ×1 IMPLANT
SUT MNCRL 0 VIOLET 6X18 (SUTURE) ×1 IMPLANT
SUT MONOCRYL 0 (SUTURE) ×1
SUT MONOCRYL 0 6X18 (SUTURE) ×1
SUT MONOCRYL 0 MO 4 18  CR/8 (SUTURE) ×2
SUT VIC AB 0 CT1 36 (SUTURE) ×2 IMPLANT
SUT VIC AB 3-0 PS2 18 (SUTURE) ×2
SUT VIC AB 3-0 PS2 18XBRD (SUTURE) ×1 IMPLANT
SUT VIC AB 3-0 SH 27 (SUTURE)
SUT VIC AB 3-0 SH 27X BRD (SUTURE) IMPLANT
SUT VIC AB 4-0 PS2 18 (SUTURE) ×2 IMPLANT
SUT VICRYL 0 UR6 27IN ABS (SUTURE) ×2 IMPLANT
SYR 3ML 23GX1 SAFETY (SYRINGE) IMPLANT
SYR BULB IRRIG 60ML STRL (SYRINGE) ×2 IMPLANT
SYS BAG RETRIEVAL 10MM (BASKET)
SYSTEM BAG RETRIEVAL 10MM (BASKET) IMPLANT
TOWEL OR 17X26 10 PK STRL BLUE (TOWEL DISPOSABLE) ×4 IMPLANT
TRAY FOLEY W/BAG SLVR 14FR LF (SET/KITS/TRAYS/PACK) ×2 IMPLANT
TRENDGUARD 450 HYBRID PRO PACK (MISCELLANEOUS)
TROCAR OPTI TIP 5M 100M (ENDOMECHANICALS) ×2 IMPLANT
TROCAR XCEL NON-BLD 11X100MML (ENDOMECHANICALS) ×2 IMPLANT
WARMER LAPAROSCOPE (MISCELLANEOUS) ×2 IMPLANT
WATER STERILE IRR 500ML POUR (IV SOLUTION) ×2 IMPLANT

## 2021-12-15 NOTE — Op Note (Signed)
Diagnostic Laparoscopy Procedure Note  Indications: The patient is a 46 y.o. 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    Pre-operative Diagnosis: lynch syndrome  Post-operative Diagnosis: same  Surgeon: Zelphia Cairo   Assistants: Richardean Chimera, MD  Anesthesia: general  Procedure Details  The patient was seen in the Holding Room. The risks, benefits, complications, treatment options, and expected outcomes were discussed with the patient. The possibilities of reaction to medication, pulmonary aspiration, perforation of viscus, bleeding, recurrent infection, the need for additional procedures, failure to diagnose a condition, and creating a complication requiring transfusion or operation were discussed with the patient. The patient concurred with the proposed plan, giving informed consent. The patient was taken to the Operating Room, identified as Lauren Reynolds and the procedure verified as Diagnostic Laparoscopy. A Time Out was held and the above information confirmed.  After induction of general anesthesia, the patient was placed in modified dorsal lithotomy position where she was prepped, draped, and catheterized in the normal, sterile fashion.  The cervix was visualized and an intrauterine manipulator was placed. An umbilical incision was then performed. Optical trocar was passed and pneumoperitoneum was established.  A suprapubic incision was made and a 4mm trocar was inserted under direct visualization.    The uterus was manipulated to the patients right and the left adnexa was grasped and tented toward midline.  The Enseal was used to grasp, cauterize, and cut the infundibulopelvic ligament. This was extended down the mesosalpinx to the round ligament.  The round ligament was then grasped, cauterized and cut with the enseal.  Hemostasis was noted and the procedure was repeated on the right adnexa.  Instruments were removed from the abdominal trocars and our attention  was then turned to the vagina.    The cervical manipulator was removed the the cervix was grasped with a toothed tenaculum.  A circumferential incision was made on the cervix.  The posterior cul de sac was entered sharply and a long weighted speculum was placed in the posterior cul de sac.  Bilateral uterosacral ligaments were clamped, cut and suture ligated.  The anterior cul de sac was entered sharply and a curved retractor was placed.  The cardinal ligament and uterine arteries were clamped, cut, and suture ligated.  The fundus was then grasped with a tenaculum and delivered through the posterior cul de sac.  Remaining pedicles were clamped, cut and suture ligated.  The posterior vaginal cuff was reapproximated to peritoneum using monocryl.  The vaginal cuff was then closed using a series of figure of eight sutures.  Hemostasis was noted.  Our attention was then turned to the abdomen.  All pedicles were inspected and the pelvis was irrigated.  Small oozing noted and cauterized using the bipolar.  Once hemostasis was assured, trocars and instruments were removed from the abdomen.   The incision was closed with subcutaneous and subcuticular sutures of 4-0 Vicryl.  Instrument, sponge, and needle counts were correct prior to abdominal closure and at the conclusion of the case.   Estimated Blood Loss:  350cc         Specimens: uterus, bilateral tubes and ovaries              Complications:  None; patient tolerated the procedure well.         Disposition: PACU - hemodynamically stable.         Condition: stable

## 2021-12-15 NOTE — Anesthesia Postprocedure Evaluation (Signed)
Anesthesia Post Note  Patient: Lauren Reynolds  Procedure(s) Performed: LAPAROSCOPIC ASSISTED VAGINAL HYSTERECTOMY WITH BILATERAL SALPINGO OOPHORECTOMY (Bilateral: Abdomen)     Anesthesia Post Evaluation No notable events documented.  Last Vitals:  Vitals:   12/15/21 0621 12/15/21 0945  BP: 115/69 124/71  Pulse: 74 86  Resp: 14 12  Temp: 37 C 36.8 C  SpO2: 99% 100%    Last Pain:  Vitals:   12/15/21 0945  TempSrc:   PainSc: 0-No pain                 Dayla Gasca

## 2021-12-15 NOTE — Progress Notes (Signed)
No change to H&P Questions answered Informed consent

## 2021-12-15 NOTE — Brief Op Note (Signed)
12/15/2021  9:30 AM  PATIENT:  Lauren Reynolds  46 y.o. female  PRE-OPERATIVE DIAGNOSIS:  LYNCH SYNDROME  POST-OPERATIVE DIAGNOSIS:  LYNCH SYNDROME  PROCEDURE:  Procedure(s): LAPAROSCOPIC ASSISTED VAGINAL HYSTERECTOMY WITH BILATERAL SALPINGO OOPHORECTOMY (Bilateral)  SURGEON:  Surgeon(s) and Role:    * Zelphia Cairo, MD - Primary    * McComb, John, MD - Assisting  ANESTHESIA:   general  EBL:  350 mL   BLOOD ADMINISTERED:none  DRAINS: none   LOCAL MEDICATIONS USED:  MARCAINE     SPECIMEN:  Source of Specimen:  uterus, tubes and ovaries  DISPOSITION OF SPECIMEN:  PATHOLOGY  COUNTS:  YES  TOURNIQUET:  * No tourniquets in log *  DICTATION: .Other Dictation: Dictation Number pending  PLAN OF CARE: Admit for overnight observation  PATIENT DISPOSITION:  PACU - hemodynamically stable.   Delay start of Pharmacological VTE agent (>24hrs) due to surgical blood loss or risk of bleeding: no

## 2021-12-15 NOTE — Anesthesia Procedure Notes (Signed)
Procedure Name: Intubation Date/Time: 12/15/2021 7:43 AM Performed by: Justice Rocher, CRNA Pre-anesthesia Checklist: Patient identified, Emergency Drugs available, Suction available, Patient being monitored and Timeout performed Patient Re-evaluated:Patient Re-evaluated prior to induction Oxygen Delivery Method: Circle system utilized Preoxygenation: Pre-oxygenation with 100% oxygen Induction Type: IV induction Ventilation: Mask ventilation without difficulty Laryngoscope Size: Mac and 3 Grade View: Grade II Tube type: Oral Tube size: 7.0 mm Number of attempts: 1 Airway Equipment and Method: Stylet and Oral airway Placement Confirmation: ETT inserted through vocal cords under direct vision, positive ETCO2, breath sounds checked- equal and bilateral and CO2 detector Secured at: 23 cm Tube secured with: Tape Dental Injury: Teeth and Oropharynx as per pre-operative assessment

## 2021-12-15 NOTE — Transfer of Care (Signed)
Immediate Anesthesia Transfer of Care Note  Patient: Lauren Reynolds  Procedure(s) Performed: Procedure(s) (LRB): LAPAROSCOPIC ASSISTED VAGINAL HYSTERECTOMY WITH BILATERAL SALPINGO OOPHORECTOMY (Bilateral)  Patient Location: PACU  Anesthesia Type: General  Level of Consciousness: awake, sedated, patient cooperative and responds to stimulation  Airway & Oxygen Therapy: Patient Spontanous Breathing and Patient connected to [place] 02 and soft FM   Post-op Assessment: Report given to PACU RN, Post -op Vital signs reviewed and stable and Patient moving all extremities  Post vital signs: Reviewed and stable  Complications: No apparent anesthesia complications

## 2021-12-15 NOTE — Anesthesia Preprocedure Evaluation (Addendum)
Anesthesia Evaluation  Patient identified by MRN, date of birth, ID band Patient awake    Reviewed: Allergy & Precautions, NPO status , Patient's Chart, lab work & pertinent test results  Airway Mallampati: I  TM Distance: >3 FB Neck ROM: Full    Dental  (+) Teeth Intact, Dental Advisory Given   Pulmonary former smoker,  Quit smoking 1993, 2 pack year history    Pulmonary exam normal breath sounds clear to auscultation       Cardiovascular negative cardio ROS Normal cardiovascular exam Rhythm:Regular Rate:Normal     Neuro/Psych PSYCHIATRIC DISORDERS Anxiety negative neurological ROS     GI/Hepatic Neg liver ROS, PUD, GERD  Controlled and Medicated,  Endo/Other  negative endocrine ROS  Renal/GU negative Renal ROS  Female GU complaint (lynch syn)     Musculoskeletal negative musculoskeletal ROS (+)   Abdominal   Peds  Hematology negative hematology ROS (+)   Anesthesia Other Findings   Reproductive/Obstetrics negative OB ROS                            Anesthesia Physical Anesthesia Plan  ASA: 2  Anesthesia Plan: General   Post-op Pain Management: Ofirmev IV (intra-op) and Toradol IV (intra-op)   Induction: Intravenous  PONV Risk Score and Plan: 3 and Ondansetron, Dexamethasone, Midazolam and Treatment may vary due to age or medical condition  Airway Management Planned: Oral ETT  Additional Equipment: None  Intra-op Plan:   Post-operative Plan: Extubation in OR  Informed Consent: I have reviewed the patients History and Physical, chart, labs and discussed the procedure including the risks, benefits and alternatives for the proposed anesthesia with the patient or authorized representative who has indicated his/her understanding and acceptance.     Dental advisory given  Plan Discussed with: CRNA  Anesthesia Plan Comments:         Anesthesia Quick Evaluation

## 2021-12-16 DIAGNOSIS — Z800 Family history of malignant neoplasm of digestive organs: Secondary | ICD-10-CM | POA: Diagnosis not present

## 2021-12-16 DIAGNOSIS — Z87891 Personal history of nicotine dependence: Secondary | ICD-10-CM | POA: Diagnosis not present

## 2021-12-16 DIAGNOSIS — Z4002 Encounter for prophylactic removal of ovary: Secondary | ICD-10-CM | POA: Diagnosis not present

## 2021-12-16 DIAGNOSIS — Z803 Family history of malignant neoplasm of breast: Secondary | ICD-10-CM | POA: Diagnosis not present

## 2021-12-16 DIAGNOSIS — Z1509 Genetic susceptibility to other malignant neoplasm: Secondary | ICD-10-CM | POA: Diagnosis not present

## 2021-12-16 DIAGNOSIS — Z1502 Genetic susceptibility to malignant neoplasm of ovary: Secondary | ICD-10-CM | POA: Diagnosis not present

## 2021-12-16 DIAGNOSIS — Z4009 Encounter for prophylactic removal of other organ: Secondary | ICD-10-CM | POA: Diagnosis not present

## 2021-12-16 LAB — CBC
HCT: 30.7 % — ABNORMAL LOW (ref 36.0–46.0)
Hemoglobin: 10.3 g/dL — ABNORMAL LOW (ref 12.0–15.0)
MCH: 30.1 pg (ref 26.0–34.0)
MCHC: 33.6 g/dL (ref 30.0–36.0)
MCV: 89.8 fL (ref 80.0–100.0)
Platelets: 237 10*3/uL (ref 150–400)
RBC: 3.42 MIL/uL — ABNORMAL LOW (ref 3.87–5.11)
RDW: 12.3 % (ref 11.5–15.5)
WBC: 9 10*3/uL (ref 4.0–10.5)
nRBC: 0 % (ref 0.0–0.2)

## 2021-12-16 MED ORDER — OXYCODONE-ACETAMINOPHEN 5-325 MG PO TABS
ORAL_TABLET | ORAL | Status: AC
  Filled 2021-12-16: qty 2

## 2021-12-16 NOTE — Discharge Summary (Signed)
Physician Discharge Summary  Patient ID: MARYRUTH AMIEL MRN: [id number] DOB/AGE: January 30, 1976 46 y.o.  Admit date: 12/15/2021 Discharge date: 12/16/2021  Admission Diagnoses:  Discharge Diagnoses:  Principal Problem:   Carrier of gene for Lynch syndrome   Discharged Condition: good  Hospital Course: Pt was admitted overnight for post op care.  Initially her pain was controlled with IV pain medications.  Once she was able to tolerate PO intake, pain was controlled with oral pain medications.  Foley was removed and she was able to ambulate and void without difficulty.    Consults: None  Significant Diagnostic Studies: labs: cbc  Treatments: surgery: LAVH/BSO  Discharge Exam: Blood pressure 105/60, pulse 81, temperature 98.6 F (37 C), resp. rate 16, height 5\' 2"  (1.575 m), weight 61.2 kg, SpO2 98 %. General appearance: alert and cooperative GI: normal findings: bowel sounds normal and soft, non-tender Extremities: extremities normal, atraumatic, no cyanosis or edema  Disposition:      Follow-up Information     [name], MD. Schedule an appointment as soon as possible for a visit in 2 week(s).   Specialty: Obstetrics and Gynecology Contact information: [address], [place] [address] [phone]                 Signed: [name] 12/16/2021, 8:18 AM

## 2021-12-19 ENCOUNTER — Encounter (HOSPITAL_BASED_OUTPATIENT_CLINIC_OR_DEPARTMENT_OTHER): Payer: Self-pay | Admitting: Obstetrics and Gynecology

## 2021-12-19 LAB — SURGICAL PATHOLOGY

## 2022-01-16 ENCOUNTER — Ambulatory Visit (HOSPITAL_COMMUNITY): Payer: BC Managed Care – PPO

## 2022-01-24 DIAGNOSIS — M5137 Other intervertebral disc degeneration, lumbosacral region: Secondary | ICD-10-CM | POA: Diagnosis not present

## 2022-02-01 ENCOUNTER — Encounter: Payer: Self-pay | Admitting: Internal Medicine

## 2022-02-14 DIAGNOSIS — M5416 Radiculopathy, lumbar region: Secondary | ICD-10-CM | POA: Diagnosis not present

## 2022-02-15 DIAGNOSIS — M5416 Radiculopathy, lumbar region: Secondary | ICD-10-CM | POA: Diagnosis not present

## 2022-02-21 DIAGNOSIS — M5416 Radiculopathy, lumbar region: Secondary | ICD-10-CM | POA: Diagnosis not present

## 2022-02-22 DIAGNOSIS — L918 Other hypertrophic disorders of the skin: Secondary | ICD-10-CM | POA: Diagnosis not present

## 2022-02-23 DIAGNOSIS — M5416 Radiculopathy, lumbar region: Secondary | ICD-10-CM | POA: Diagnosis not present

## 2022-02-28 DIAGNOSIS — M5416 Radiculopathy, lumbar region: Secondary | ICD-10-CM | POA: Diagnosis not present

## 2022-03-01 DIAGNOSIS — D2261 Melanocytic nevi of right upper limb, including shoulder: Secondary | ICD-10-CM | POA: Diagnosis not present

## 2022-03-01 DIAGNOSIS — L821 Other seborrheic keratosis: Secondary | ICD-10-CM | POA: Diagnosis not present

## 2022-03-01 DIAGNOSIS — D2262 Melanocytic nevi of left upper limb, including shoulder: Secondary | ICD-10-CM | POA: Diagnosis not present

## 2022-03-01 DIAGNOSIS — L718 Other rosacea: Secondary | ICD-10-CM | POA: Diagnosis not present

## 2022-03-02 DIAGNOSIS — M5416 Radiculopathy, lumbar region: Secondary | ICD-10-CM | POA: Diagnosis not present

## 2022-03-14 DIAGNOSIS — M5416 Radiculopathy, lumbar region: Secondary | ICD-10-CM | POA: Diagnosis not present

## 2022-03-16 DIAGNOSIS — M5416 Radiculopathy, lumbar region: Secondary | ICD-10-CM | POA: Diagnosis not present

## 2022-04-13 DIAGNOSIS — N926 Irregular menstruation, unspecified: Secondary | ICD-10-CM | POA: Diagnosis not present

## 2022-04-13 DIAGNOSIS — N760 Acute vaginitis: Secondary | ICD-10-CM | POA: Diagnosis not present

## 2022-04-25 DIAGNOSIS — Z6825 Body mass index (BMI) 25.0-25.9, adult: Secondary | ICD-10-CM | POA: Diagnosis not present

## 2022-04-25 DIAGNOSIS — Z01419 Encounter for gynecological examination (general) (routine) without abnormal findings: Secondary | ICD-10-CM | POA: Diagnosis not present

## 2022-04-25 DIAGNOSIS — Z1231 Encounter for screening mammogram for malignant neoplasm of breast: Secondary | ICD-10-CM | POA: Diagnosis not present

## 2022-04-27 ENCOUNTER — Other Ambulatory Visit: Payer: Self-pay | Admitting: Obstetrics and Gynecology

## 2022-04-27 DIAGNOSIS — R928 Other abnormal and inconclusive findings on diagnostic imaging of breast: Secondary | ICD-10-CM

## 2022-05-04 ENCOUNTER — Other Ambulatory Visit: Payer: Self-pay | Admitting: Obstetrics and Gynecology

## 2022-05-04 ENCOUNTER — Ambulatory Visit
Admission: RE | Admit: 2022-05-04 | Discharge: 2022-05-04 | Disposition: A | Discharge status: Home / Self Care | Payer: BC Managed Care – PPO | Source: Ambulatory Visit | Attending: Obstetrics and Gynecology | Admitting: Obstetrics and Gynecology

## 2022-05-04 ENCOUNTER — Encounter: Payer: Self-pay | Admitting: Internal Medicine

## 2022-05-04 DIAGNOSIS — N6489 Other specified disorders of breast: Secondary | ICD-10-CM

## 2022-05-04 DIAGNOSIS — N6001 Solitary cyst of right breast: Secondary | ICD-10-CM | POA: Diagnosis not present

## 2022-05-04 DIAGNOSIS — R928 Other abnormal and inconclusive findings on diagnostic imaging of breast: Secondary | ICD-10-CM

## 2022-05-04 DIAGNOSIS — R922 Inconclusive mammogram: Secondary | ICD-10-CM | POA: Diagnosis not present

## 2022-05-04 LAB — HM MAMMOGRAPHY

## 2022-05-05 ENCOUNTER — Ambulatory Visit: Payer: BC Managed Care – PPO | Admitting: Family Medicine

## 2022-05-26 ENCOUNTER — Ambulatory Visit
Admission: RE | Admit: 2022-05-26 | Discharge: 2022-05-26 | Disposition: A | Discharge status: Home / Self Care | Payer: BC Managed Care – PPO | Source: Ambulatory Visit | Attending: Obstetrics and Gynecology | Admitting: Obstetrics and Gynecology

## 2022-05-26 DIAGNOSIS — N6489 Other specified disorders of breast: Secondary | ICD-10-CM

## 2022-05-26 DIAGNOSIS — R928 Other abnormal and inconclusive findings on diagnostic imaging of breast: Secondary | ICD-10-CM | POA: Diagnosis not present

## 2022-05-26 DIAGNOSIS — N62 Hypertrophy of breast: Secondary | ICD-10-CM | POA: Diagnosis not present

## 2022-06-23 ENCOUNTER — Ambulatory Visit: Payer: Self-pay | Admitting: Surgery

## 2022-06-23 DIAGNOSIS — N6489 Other specified disorders of breast: Secondary | ICD-10-CM

## 2022-06-28 ENCOUNTER — Other Ambulatory Visit: Payer: Self-pay | Admitting: Surgery

## 2022-06-28 DIAGNOSIS — N6489 Other specified disorders of breast: Secondary | ICD-10-CM

## 2022-07-28 ENCOUNTER — Other Ambulatory Visit: Payer: Self-pay

## 2022-07-28 ENCOUNTER — Encounter (HOSPITAL_BASED_OUTPATIENT_CLINIC_OR_DEPARTMENT_OTHER): Payer: Self-pay | Admitting: Surgery

## 2022-08-04 ENCOUNTER — Ambulatory Visit
Admission: RE | Admit: 2022-08-04 | Discharge: 2022-08-04 | Disposition: A | Discharge status: Home / Self Care | Payer: BC Managed Care – PPO | Source: Ambulatory Visit | Attending: Surgery | Admitting: Surgery

## 2022-08-04 DIAGNOSIS — N6489 Other specified disorders of breast: Secondary | ICD-10-CM

## 2022-08-04 DIAGNOSIS — R928 Other abnormal and inconclusive findings on diagnostic imaging of breast: Secondary | ICD-10-CM | POA: Diagnosis not present

## 2022-08-04 NOTE — Progress Notes (Signed)
       Patient Instructions  The night before surgery:  No food after midnight. ONLY clear liquids after midnight  The day of surgery (if you do NOT have diabetes):  Drink ONE (1) Pre-Surgery Clear Ensure as directed.   This drink was given to you during your hospital  pre-op appointment visit. The pre-op nurse will instruct you on the time to drink the  Pre-Surgery Ensure depending on your surgery time. Finish the drink at the designated time by the pre-op nurse.  Nothing else to drink after completing the  Pre-Surgery Clear Ensure.  The day of surgery (if you have diabetes): Drink ONE (1) Gatorade 2 (G2) as directed. This drink was given to you during your hospital  pre-op appointment visit.  The pre-op nurse will instruct you on the time to drink the   Gatorade 2 (G2) depending on your surgery time. Color of the Gatorade may vary. Red is not allowed. Nothing else to drink after completing the  Gatorade 2 (G2).         If you have questions, please contact your surgeon's office.Gave patient CHG soap with instructions, patient verbalized understanding.  

## 2022-08-08 ENCOUNTER — Encounter (HOSPITAL_BASED_OUTPATIENT_CLINIC_OR_DEPARTMENT_OTHER)
Admission: RE | Disposition: A | Discharge status: Home / Self Care | Payer: Self-pay | Source: Ambulatory Visit | Attending: Surgery

## 2022-08-08 ENCOUNTER — Ambulatory Visit
Admission: RE | Admit: 2022-08-08 | Discharge: 2022-08-08 | Disposition: A | Discharge status: Home / Self Care | Payer: BC Managed Care – PPO | Source: Ambulatory Visit | Attending: Surgery | Admitting: Surgery

## 2022-08-08 ENCOUNTER — Ambulatory Visit (HOSPITAL_BASED_OUTPATIENT_CLINIC_OR_DEPARTMENT_OTHER): Payer: BC Managed Care – PPO | Admitting: Anesthesiology

## 2022-08-08 ENCOUNTER — Ambulatory Visit (HOSPITAL_BASED_OUTPATIENT_CLINIC_OR_DEPARTMENT_OTHER)
Admission: RE | Admit: 2022-08-08 | Discharge: 2022-08-08 | Disposition: A | Discharge status: Home / Self Care | Payer: BC Managed Care – PPO | Source: Ambulatory Visit | Attending: Surgery | Admitting: Surgery

## 2022-08-08 ENCOUNTER — Other Ambulatory Visit: Payer: Self-pay

## 2022-08-08 ENCOUNTER — Encounter (HOSPITAL_BASED_OUTPATIENT_CLINIC_OR_DEPARTMENT_OTHER): Payer: Self-pay | Admitting: Surgery

## 2022-08-08 DIAGNOSIS — L905 Scar conditions and fibrosis of skin: Secondary | ICD-10-CM | POA: Diagnosis not present

## 2022-08-08 DIAGNOSIS — N6489 Other specified disorders of breast: Secondary | ICD-10-CM

## 2022-08-08 DIAGNOSIS — Z803 Family history of malignant neoplasm of breast: Secondary | ICD-10-CM | POA: Insufficient documentation

## 2022-08-08 DIAGNOSIS — N62 Hypertrophy of breast: Secondary | ICD-10-CM | POA: Diagnosis not present

## 2022-08-08 DIAGNOSIS — K219 Gastro-esophageal reflux disease without esophagitis: Secondary | ICD-10-CM | POA: Insufficient documentation

## 2022-08-08 DIAGNOSIS — R928 Other abnormal and inconclusive findings on diagnostic imaging of breast: Secondary | ICD-10-CM | POA: Diagnosis not present

## 2022-08-08 DIAGNOSIS — N6011 Diffuse cystic mastopathy of right breast: Secondary | ICD-10-CM | POA: Insufficient documentation

## 2022-08-08 DIAGNOSIS — F419 Anxiety disorder, unspecified: Secondary | ICD-10-CM | POA: Diagnosis not present

## 2022-08-08 DIAGNOSIS — Z87891 Personal history of nicotine dependence: Secondary | ICD-10-CM | POA: Insufficient documentation

## 2022-08-08 DIAGNOSIS — N6081 Other benign mammary dysplasias of right breast: Secondary | ICD-10-CM | POA: Diagnosis not present

## 2022-08-08 HISTORY — PX: BREAST LUMPECTOMY WITH RADIOACTIVE SEED LOCALIZATION: SHX6424

## 2022-08-08 SURGERY — BREAST LUMPECTOMY WITH RADIOACTIVE SEED LOCALIZATION
Anesthesia: General | Site: Breast | Laterality: Right

## 2022-08-08 MED ORDER — SODIUM CHLORIDE 0.9 % IV SOLN
INTRAVENOUS | Status: AC
  Filled 2022-08-08: qty 10

## 2022-08-08 MED ORDER — LIDOCAINE 2% (20 MG/ML) 5 ML SYRINGE
INTRAMUSCULAR | Status: AC
  Filled 2022-08-08: qty 5

## 2022-08-08 MED ORDER — FENTANYL CITRATE (PF) 100 MCG/2ML IJ SOLN
INTRAMUSCULAR | Status: AC
  Filled 2022-08-08: qty 2

## 2022-08-08 MED ORDER — LIDOCAINE HCL (CARDIAC) PF 100 MG/5ML IV SOSY
PREFILLED_SYRINGE | INTRAVENOUS | Status: DC | PRN
  Administered 2022-08-08: 80 mg via INTRAVENOUS

## 2022-08-08 MED ORDER — OXYCODONE HCL 5 MG PO TABS: 5.0000 mg | ORAL_TABLET | Freq: Four times a day (QID) | ORAL | 0 refills | Status: DC | PRN

## 2022-08-08 MED ORDER — MIDAZOLAM HCL 2 MG/2ML IJ SOLN
INTRAMUSCULAR | Status: AC
  Filled 2022-08-08: qty 2

## 2022-08-08 MED ORDER — CEFAZOLIN SODIUM-DEXTROSE 2-4 GM/100ML-% IV SOLN
2.0000 g | INTRAVENOUS | Status: AC
  Administered 2022-08-08: 2 g via INTRAVENOUS

## 2022-08-08 MED ORDER — ONDANSETRON HCL 4 MG/2ML IJ SOLN
INTRAMUSCULAR | Status: AC
  Filled 2022-08-08: qty 2

## 2022-08-08 MED ORDER — FENTANYL CITRATE (PF) 100 MCG/2ML IJ SOLN
INTRAMUSCULAR | Status: DC | PRN
  Administered 2022-08-08 (×2): 25 ug via INTRAVENOUS

## 2022-08-08 MED ORDER — PROPOFOL 10 MG/ML IV BOLUS
INTRAVENOUS | Status: AC
  Filled 2022-08-08: qty 20

## 2022-08-08 MED ORDER — PROPOFOL 10 MG/ML IV BOLUS
INTRAVENOUS | Status: DC | PRN
  Administered 2022-08-08: 140 mg via INTRAVENOUS

## 2022-08-08 MED ORDER — CHLORHEXIDINE GLUCONATE CLOTH 2 % EX PADS: 6.0000 | MEDICATED_PAD | Freq: Once | CUTANEOUS | Status: DC

## 2022-08-08 MED ORDER — LACTATED RINGERS IV SOLN: INTRAVENOUS | Status: DC

## 2022-08-08 MED ORDER — ONDANSETRON HCL 4 MG/2ML IJ SOLN
INTRAMUSCULAR | Status: DC | PRN
  Administered 2022-08-08: 4 mg via INTRAVENOUS

## 2022-08-08 MED ORDER — EPHEDRINE 5 MG/ML INJ
INTRAVENOUS | Status: AC
  Filled 2022-08-08: qty 5

## 2022-08-08 MED ORDER — ACETAMINOPHEN 500 MG PO TABS
ORAL_TABLET | ORAL | Status: AC
  Filled 2022-08-08: qty 2

## 2022-08-08 MED ORDER — EPHEDRINE SULFATE (PRESSORS) 50 MG/ML IJ SOLN
INTRAMUSCULAR | Status: DC | PRN
  Administered 2022-08-08 (×2): 10 mg via INTRAVENOUS

## 2022-08-08 MED ORDER — BUPIVACAINE-EPINEPHRINE (PF) 0.25% -1:200000 IJ SOLN
INTRAMUSCULAR | Status: DC | PRN
  Administered 2022-08-08: 20 mL

## 2022-08-08 MED ORDER — DEXAMETHASONE SODIUM PHOSPHATE 10 MG/ML IJ SOLN
INTRAMUSCULAR | Status: AC
  Filled 2022-08-08: qty 1

## 2022-08-08 MED ORDER — DEXAMETHASONE SODIUM PHOSPHATE 10 MG/ML IJ SOLN
INTRAMUSCULAR | Status: DC | PRN
  Administered 2022-08-08: 10 mg via INTRAVENOUS

## 2022-08-08 MED ORDER — CEFAZOLIN SODIUM-DEXTROSE 2-4 GM/100ML-% IV SOLN
INTRAVENOUS | Status: AC
  Filled 2022-08-08: qty 100

## 2022-08-08 MED ORDER — BUPIVACAINE-EPINEPHRINE (PF) 0.25% -1:200000 IJ SOLN
INTRAMUSCULAR | Status: AC
  Filled 2022-08-08: qty 30

## 2022-08-08 MED ORDER — ACETAMINOPHEN 500 MG PO TABS
1000.0000 mg | ORAL_TABLET | ORAL | Status: AC
  Administered 2022-08-08: 1000 mg via ORAL

## 2022-08-08 SURGICAL SUPPLY — 46 items
ADH SKN CLS APL DERMABOND .7 (GAUZE/BANDAGES/DRESSINGS) ×1
APL PRP STRL LF DISP 70% ISPRP (MISCELLANEOUS) ×1
APPLIER CLIP 9.375 MED OPEN (MISCELLANEOUS)
APR CLP MED 9.3 20 MLT OPN (MISCELLANEOUS)
BINDER BREAST LRG (GAUZE/BANDAGES/DRESSINGS) IMPLANT
BINDER BREAST MEDIUM (GAUZE/BANDAGES/DRESSINGS) IMPLANT
BLADE SURG 15 STRL LF DISP TIS (BLADE) ×1 IMPLANT
BLADE SURG 15 STRL SS (BLADE) ×1
CANISTER SUC SOCK COL 7IN (MISCELLANEOUS) IMPLANT
CANISTER SUCT 1200ML W/VALVE (MISCELLANEOUS) IMPLANT
CHLORAPREP W/TINT 26 (MISCELLANEOUS) ×1 IMPLANT
CLIP APPLIE 9.375 MED OPEN (MISCELLANEOUS) IMPLANT
COVER BACK TABLE 60X90IN (DRAPES) ×1 IMPLANT
COVER MAYO STAND STRL (DRAPES) ×1 IMPLANT
COVER PROBE W GEL 5X96 (DRAPES) ×1 IMPLANT
DERMABOND ADVANCED .7 DNX12 (GAUZE/BANDAGES/DRESSINGS) ×1 IMPLANT
DRAPE LAPAROTOMY 100X72 PEDS (DRAPES) ×1 IMPLANT
DRAPE UTILITY XL STRL (DRAPES) ×1 IMPLANT
ELECT COATED BLADE 2.86 ST (ELECTRODE) ×1 IMPLANT
ELECT REM PT RETURN 9FT ADLT (ELECTROSURGICAL) ×1
ELECTRODE REM PT RTRN 9FT ADLT (ELECTROSURGICAL) ×1 IMPLANT
GLOVE BIO SURGEON STRL SZ 6.5 (GLOVE) IMPLANT
GLOVE BIOGEL PI IND STRL 8 (GLOVE) ×1 IMPLANT
GLOVE ECLIPSE 8.0 STRL XLNG CF (GLOVE) ×1 IMPLANT
GOWN STRL REUS W/ TWL LRG LVL3 (GOWN DISPOSABLE) ×2 IMPLANT
GOWN STRL REUS W/ TWL XL LVL3 (GOWN DISPOSABLE) ×1 IMPLANT
GOWN STRL REUS W/TWL LRG LVL3 (GOWN DISPOSABLE) ×2
GOWN STRL REUS W/TWL XL LVL3 (GOWN DISPOSABLE) ×1
HEMOSTAT ARISTA ABSORB 3G PWDR (HEMOSTASIS) IMPLANT
HEMOSTAT SNOW SURGICEL 2X4 (HEMOSTASIS) IMPLANT
KIT MARKER MARGIN INK (KITS) ×1 IMPLANT
NDL HYPO 25X1 1.5 SAFETY (NEEDLE) ×1 IMPLANT
NEEDLE HYPO 25X1 1.5 SAFETY (NEEDLE) ×1 IMPLANT
NS IRRIG 1000ML POUR BTL (IV SOLUTION) ×1 IMPLANT
PACK BASIN DAY SURGERY FS (CUSTOM PROCEDURE TRAY) ×1 IMPLANT
PENCIL SMOKE EVACUATOR (MISCELLANEOUS) ×1 IMPLANT
SLEEVE SCD COMPRESS KNEE MED (STOCKING) ×1 IMPLANT
SPONGE T-LAP 4X18 ~~LOC~~+RFID (SPONGE) ×1 IMPLANT
SUT MNCRL AB 4-0 PS2 18 (SUTURE) ×1 IMPLANT
SUT SILK 2 0 SH (SUTURE) IMPLANT
SUT VICRYL 3-0 CR8 SH (SUTURE) ×1 IMPLANT
SYR CONTROL 10ML LL (SYRINGE) ×1 IMPLANT
TOWEL GREEN STERILE FF (TOWEL DISPOSABLE) ×1 IMPLANT
TRAY FAXITRON CT DISP (TRAY / TRAY PROCEDURE) ×1 IMPLANT
TUBE CONNECTING 20X1/4 (TUBING) IMPLANT
YANKAUER SUCT BULB TIP NO VENT (SUCTIONS) IMPLANT

## 2022-08-08 NOTE — Anesthesia Procedure Notes (Signed)
Procedure Name: LMA Insertion Date/Time: 08/08/2022 12:11 PM  Performed by: Lauralyn Primes, CRNAPre-anesthesia Checklist: Patient identified, Emergency Drugs available, Suction available and Patient being monitored Patient Re-evaluated:Patient Re-evaluated prior to induction Oxygen Delivery Method: Circle system utilized Preoxygenation: Pre-oxygenation with 100% oxygen Induction Type: IV induction Ventilation: Mask ventilation without difficulty LMA: LMA inserted LMA Size: 4.0 Number of attempts: 1 Airway Equipment and Method: Bite block Placement Confirmation: positive ETCO2 Tube secured with: Tape Dental Injury: Teeth and Oropharynx as per pre-operative assessment

## 2022-08-08 NOTE — Discharge Instructions (Addendum)
Post Anesthesia Home Care Instructions  Activity: Get plenty of rest for the remainder of the day. A responsible individual must stay with you for 24 hours following the procedure.  For the next 24 hours, DO NOT: -Drive a car -Advertising copywriter -Drink alcoholic beverages -Take any medication unless instructed by your physician -Make any legal decisions or sign important papers.  Meals: Start with liquid foods such as gelatin or soup. Progress to regular foods as tolerated. Avoid greasy, spicy, heavy foods. If nausea and/or vomiting occur, drink only clear liquids until the nausea and/or vomiting subsides. Call your physician if vomiting continues.  Special Instructions/Symptoms: Your throat may feel dry or sore from the anesthesia or the breathing tube placed in your throat during surgery. If this causes discomfort, gargle with warm salt water. The discomfort should disappear within 24 hours.  If you had a scopolamine patch placed behind your ear for the management of post- operative nausea and/or vomiting:  1. The medication in the patch is effective for 72 hours, after which it should be removed.  Wrap patch in a tissue and discard in the trash. Wash hands thoroughly with soap and water. 2. You may remove the patch earlier than 72 hours if you experience unpleasant side effects which may include dry mouth, dizziness or visual disturbances. 3. Avoid touching the patch. Wash your hands with soap and water after contact with the patch.      Next dose of Tylenol can be given at 4:30pm if needed.      [facility] Office Phone Number [phone]  BREAST BIOPSY/ PARTIAL MASTECTOMY: POST OP INSTRUCTIONS  Always review your discharge instruction sheet given to you by the facility where your surgery was performed.  IF YOU HAVE DISABILITY OR FAMILY LEAVE FORMS, YOU MUST BRING THEM TO THE OFFICE FOR PROCESSING.  DO NOT GIVE THEM TO YOUR DOCTOR.  A prescription for pain  medication may be given to you upon discharge.  Take your pain medication as prescribed, if needed.  If narcotic pain medicine is not needed, then you may take acetaminophen (Tylenol) or ibuprofen (Advil) as needed. Take your usually prescribed medications unless otherwise directed If you need a refill on your pain medication, please contact your pharmacy.  They will contact our office to request authorization.  Prescriptions will not be filled after 5pm or on week-ends. You should eat very light the first 24 hours after surgery, such as soup, crackers, pudding, etc.  Resume your normal diet the day after surgery. Most patients will experience some swelling and bruising in the breast.  Ice packs and a good support bra will help.  Swelling and bruising can take several days to resolve.  It is common to experience some constipation if taking pain medication after surgery.  Increasing fluid intake and taking a stool softener will usually help or prevent this problem from occurring.  A mild laxative (Milk of Magnesia or Miralax) should be taken according to package directions if there are no bowel movements after 48 hours. Unless discharge instructions indicate otherwise, you may remove your bandages 24-48 hours after surgery, and you may shower at that time.  You may have steri-strips (small skin tapes) in place directly over the incision.  These strips should be left on the skin for 7-10 days.  If your surgeon used skin glue on the incision, you may shower in 24 hours.  The glue will flake off over the next 2-3 weeks.  Any sutures or staples will be  removed at the office during your follow-up visit. ACTIVITIES:  You may resume regular daily activities (gradually increasing) beginning the next day.  Wearing a good support bra or sports bra minimizes pain and swelling.  You may have sexual intercourse when it is comfortable. You may drive when you no longer are taking prescription pain medication, you can  comfortably wear a seatbelt, and you can safely maneuver your car and apply brakes. RETURN TO WORK:  ______________________________________________________________________________________ Bonita Quin should see your doctor in the office for a follow-up appointment approximately two weeks after your surgery.  Your doctor's nurse will typically make your follow-up appointment when she calls you with your pathology report.  Expect your pathology report 2-3 business days after your surgery.  You may call to check if you do not hear from [place] after three days. OTHER INSTRUCTIONS: _______________________________________________________________________________________________ _____________________________________________________________________________________________________________________________________ _____________________________________________________________________________________________________________________________________ _____________________________________________________________________________________________________________________________________  WHEN TO CALL YOUR DOCTOR: Fever over 101.0 Nausea and/or vomiting. Extreme swelling or bruising. Continued bleeding from incision. Increased pain, redness, or drainage from the incision.  The clinic staff is available to answer your questions during regular business hours.  Please don't hesitate to call and ask to speak to one of the nurses for clinical concerns.  If you have a medical emergency, go to the nearest emergency room or call 911.  A surgeon from [facility] is always on call at the hospital.  For further questions, please visit [email or web address]        Post Anesthesia Home Care Instructions  Activity: Get plenty of rest for the remainder of the day. A responsible individual must stay with you for 24 hours following the procedure.  For the next 24 hours, DO NOT: -Drive a car -Advertising copywriter -Drink  alcoholic beverages -Take any medication unless instructed by your physician -Make any legal decisions or sign important papers.  Meals: Start with liquid foods such as gelatin or soup. Progress to regular foods as tolerated. Avoid greasy, spicy, heavy foods. If nausea and/or vomiting occur, drink only clear liquids until the nausea and/or vomiting subsides. Call your physician if vomiting continues.  Special Instructions/Symptoms: Your throat may feel dry or sore from the anesthesia or the breathing tube placed in your throat during surgery. If this causes discomfort, gargle with warm salt water. The discomfort should disappear within 24 hours.  If you had a scopolamine patch placed behind your ear for the management of post- operative nausea and/or vomiting:  1. The medication in the patch is effective for 72 hours, after which it should be removed.  Wrap patch in a tissue and discard in the trash. Wash hands thoroughly with soap and water. 2. You may remove the patch earlier than 72 hours if you experience unpleasant side effects which may include dry mouth, dizziness or visual disturbances. 3. Avoid touching the patch. Wash your hands with soap and water after contact with the patch.

## 2022-08-08 NOTE — Anesthesia Preprocedure Evaluation (Signed)
Anesthesia Evaluation  Patient identified by MRN, date of birth, ID band Patient awake    Reviewed: Allergy & Precautions, NPO status , Patient's Chart, lab work & pertinent test results  Airway Mallampati: II  TM Distance: >3 FB Neck ROM: Full    Dental  (+) Teeth Intact, Dental Advisory Given   Pulmonary former smoker,    Pulmonary exam normal breath sounds clear to auscultation       Cardiovascular negative cardio ROS Normal cardiovascular exam Rhythm:Regular Rate:Normal     Neuro/Psych PSYCHIATRIC DISORDERS Anxiety negative neurological ROS     GI/Hepatic Neg liver ROS, PUD, GERD  Medicated,Lynch syndrome    Endo/Other  negative endocrine ROS  Renal/GU negative Renal ROS     Musculoskeletal negative musculoskeletal ROS (+)   Abdominal   Peds  Hematology negative hematology ROS (+)   Anesthesia Other Findings Day of surgery medications reviewed with the patient.  RIGHT BREAST RADIAL SCAR  Reproductive/Obstetrics                             Anesthesia Physical Anesthesia Plan  ASA: 2  Anesthesia Plan: General   Post-op Pain Management: Tylenol PO (pre-op)*   Induction: Intravenous  PONV Risk Score and Plan: 3 and Midazolam, Dexamethasone and Ondansetron  Airway Management Planned: LMA  Additional Equipment:   Intra-op Plan:   Post-operative Plan: Extubation in OR  Informed Consent: I have reviewed the patients History and Physical, chart, labs and discussed the procedure including the risks, benefits and alternatives for the proposed anesthesia with the patient or authorized representative who has indicated his/her understanding and acceptance.     Dental advisory given  Plan Discussed with: CRNA  Anesthesia Plan Comments:         Anesthesia Quick Evaluation

## 2022-08-08 NOTE — H&P (Signed)
Chief Complaint: New Patient (Rt breast complex )   History of Present Illness: Lauren Reynolds is a 46 y.o. female who is seen today as an office consultation for evaluation of New Patient (Rt breast complex ) .   Patient presents for evaluation of abnormal mammogram. She was noted to have a density in the right breast upper outer quadrant core biopsy proven to be radial scar. Patient relates a family history of 3 first-degree relatives with breast cancer. She also has Lynch's syndrome and gets frequent colonoscopies for that. She has had genetic testing and this was negative for any changes except Lynch's syndrome. She denies breast pain nipple discharge or mass. Of note she has had bilateral breast reductions in the past.  Review of Systems: A complete review of systems was obtained from the patient. I have reviewed this information and discussed as appropriate with the patient. See HPI as well for other ROS.  ROS   Medical History: History reviewed. No pertinent past medical history.  Patient Active Problem List  Diagnosis  Chronic LUQ pain  Gallbladder polyp  Irritable bowel syndrome with constipation   Past Surgical History:  Procedure Laterality Date  breast implantation 2007  breast implantation removal 2016    Allergies  Allergen Reactions  Neomycin-Bacitracin-Polymyxin Rash  Pramoxine-Allantoin Rash  Codeine Nausea   Current Outpatient Medications on File Prior to Visit  Medication Sig Dispense Refill  multivitamin with minerals tablet Take by mouth  omeprazole (PRILOSEC) 20 MG DR capsule Take 1 capsule (20 mg total) by mouth once daily Take 30 minutes before breakfast 30 capsule 0   No current facility-administered medications on file prior to visit.   Family History  Problem Relation Age of Onset  Breast cancer Mother  Esophageal cancer Maternal Uncle  Esophageal cancer Maternal Uncle  Breast cancer Maternal Aunt  Breast cancer Maternal Aunt  Colon cancer  Neg Hx  Inflammatory bowel disease Neg Hx    Social History   Tobacco Use  Smoking Status Former  Types: Cigarettes  Start date: 1993  Quit date: 1995  Years since quitting: 28.5  Smokeless Tobacco Never    Social History   Socioeconomic History  Marital status: Married  Tobacco Use  Smoking status: Former  Types: Cigarettes  Start date: 1993  Quit date: 1995  Years since quitting: 28.5  Smokeless tobacco: Never  Substance and Sexual Activity  Alcohol use: Defer  Comment: glass of wine every other night  Drug use: Never   Objective:   Vitals:  06/23/22 0909  BP: 118/68  Pulse: 74  Temp: 36.8 C (98.3 F)  SpO2: 99%  Weight: 64.1 kg (141 lb 6.4 oz)  Height: 157.5 cm (5\' 2" )   Body mass index is 25.86 kg/m.  Physical Exam Constitutional:  Appearance: Normal appearance.  HENT:  Head: Normocephalic.  Eyes:  Extraocular Movements: Extraocular movements intact.  Chest:  Breasts: Right: Normal.  Left: Normal.  Musculoskeletal:  General: Normal range of motion.  Cervical back: Normal range of motion.  Lymphadenopathy:  Upper Body:  Right upper body: No supraclavicular or axillary adenopathy.  Left upper body: No supraclavicular or axillary adenopathy.  Neurological:  General: No focal deficit present.  Mental Status: She is alert and oriented to person, place, and time.  Psychiatric:  Mood and Affect: Mood normal.  Behavior: Behavior normal.    Labs, Imaging and Diagnostic Testing: Diagnosis Breast, right, needle core biopsy, X clip - COMPLEX SCLEROSING LESION WITH USUAL DUCTAL HYPERPLASIA AND CALCIFICATIONS - SEE  COMMENT Microscopic Comment The complex sclerosing lesion has focal areas of architectural atypia; an excisional biopsy is recommended. These results were called to [facility] on May 29, 2022. Manning Charity MD Pathologist, Electronic Signature (Case signed 05/29/2022) Specimen Gross and Clinical  Information Specimen Comment TIF: 12:30 pm, CIT: < 2 min Specimen(s)  Assessment and Plan:   Diagnoses and all orders for this visit:  Radial scar of breast    Discussed the significance of a radial scar. Discussed potential upgrade risks for lesion to be anywhere from 5 to 15% depending on the limited resource quoted. Discussed observation and for more frequent imaging. She would like to have the area removed. Discussed right breast seed localized lumpectomy. Risk and benefits of surgery as well as complications and long-term expectations discussed. Risk of bleeding, infection, cosmetic deformity, the need for excisional or week reexcision of surgery, anesthesia risk, cardiovascular risks and the need for the treatments and/or procedures. She would like to proceed.  No follow-ups on file.  Hayden Rasmussen, MD

## 2022-08-08 NOTE — Op Note (Signed)
Preoperative diagnosis: Right breast radial scar with atypia  Postoperative diagnosis: Same  Procedure: Right breast seed localized lumpectomy  Surgeon: Erroll Luna, MD  Anesthesia: LMA with 0.25% Marcaine with epinephrine  EBL: Minimal  Specimen: Right breast tissue with seed and clip verified by Faxitron  Drains: None  Indications for procedure: The patient is a 46 year old female with a right breast mammographic abnormality.  Core biopsy showed radial scar but there was some evidence of atypia.  She presents today for right breast seed localized lumpectomy due to the atypia present.  We discussed the pros and cons of lumpectomy, the rationale for doing it and also alternatives of observation.  She voiced understanding and wished to proceed with right breast seed localized lumpectomy.The procedure has been discussed with the patient. Alternatives to surgery have been discussed with the patient.  Risks of surgery include bleeding,  Infection,  Seroma formation, death,  and the need for further surgery.   The patient understands and wishes to proceed.    Description of procedure: The patient was met in the holding area and questions were answered.  Right breast was marked as correct site and seed was placed as an outpatient.  She was then taken back to the operating room.  She is placed supine upon the OR table.  After induction of general esthesia, right breast was prepped and draped in a sterile fashion and timeout performed.  Proper patient, site and procedure verified.  Neoprobe used to identify the seed right breast.  Curvilinear incision was made along the medial border of the nipple-areolar complex.  Dissection was carried down all tissue and the seed and clip were excised with a grossly negative margin.  The tissue was oriented with ink.  Faxitron revealed the seed and clip to be present.  This was sent to pathology.  Irrigation was used.  Local anesthetic was infiltrated.  The incision  was closed with a deep layer 3-0 Vicryl.  4 Monocryl was used to close the skin in a subcuticular fashion.  Dermabond was applied.  All counts found to be correct.  The patient was awoke extubated taken to recovery in satisfactory condition.

## 2022-08-08 NOTE — Anesthesia Postprocedure Evaluation (Signed)
Anesthesia Post Note  Patient: Camera operator  Procedure(s) Performed: RIGHT BREAST SEED LUMPECTOMY (Right: Breast)     Patient location during evaluation: PACU Anesthesia Type: General Level of consciousness: awake and alert Pain management: pain level controlled Vital Signs Assessment: post-procedure vital signs reviewed and stable Respiratory status: spontaneous breathing, nonlabored ventilation, respiratory function stable and patient connected to nasal cannula oxygen Cardiovascular status: blood pressure returned to baseline and stable Postop Assessment: no apparent nausea or vomiting Anesthetic complications: no   No notable events documented.  Last Vitals:  Vitals:   08/08/22 1332 08/08/22 1356  BP:  127/64  Pulse: 74 73  Resp: 10 14  Temp:  36.5 C  SpO2: 98% 97%    Last Pain:  Vitals:   08/08/22 1356  TempSrc:   PainSc: 0-No pain                 Collene Schlichter

## 2022-08-08 NOTE — Interval H&P Note (Signed)
History and Physical Interval Note:  08/08/2022 11:38 AM  Lauren Reynolds  has presented today for surgery, with the diagnosis of RIGHT BREAST RADIAL SCAR.  The various methods of treatment have been discussed with the patient and family. After consideration of risks, benefits and other options for treatment, the patient has consented to  Procedure(s): RIGHT BREAST SEED LUMPECTOMY (Right) as a surgical intervention.  The patient's history has been reviewed, patient examined, no change in status, stable for surgery.  I have reviewed the patient's chart and labs.  Questions were answered to the patient's satisfaction.   The procedure has been discussed with the patient. Alternatives to surgery have been discussed with the patient.  Risks of surgery include bleeding,  Infection,  Seroma formation, death,  and the need for further surgery.   The patient understands and wishes to proceed.   Carmon Sahli A Gordon Vandunk

## 2022-08-08 NOTE — Transfer of Care (Signed)
Immediate Anesthesia Transfer of Care Note  Patient: Derrick A Bettinger  Procedure(s) Performed: RIGHT BREAST SEED LUMPECTOMY (Right: Breast)  Patient Location: PACU  Anesthesia Type:General  Level of Consciousness: drowsy  Airway & Oxygen Therapy: Patient Spontanous Breathing and Patient connected to face mask oxygen  Post-op Assessment: Report given to RN and Post -op Vital signs reviewed and stable  Post vital signs: Reviewed and stable  Last Vitals:  Vitals Value Taken Time  BP 125/66 08/08/22 1301  Temp    Pulse 82 08/08/22 1302  Resp 16 08/08/22 1302  SpO2 100 % 08/08/22 1302  Vitals shown include unvalidated device data.  Last Pain:  Vitals:   08/08/22 1031  TempSrc: Oral  PainSc: 0-No pain         Complications: No notable events documented.

## 2022-08-09 ENCOUNTER — Encounter (HOSPITAL_BASED_OUTPATIENT_CLINIC_OR_DEPARTMENT_OTHER): Payer: Self-pay | Admitting: Surgery

## 2022-08-09 ENCOUNTER — Encounter: Payer: Self-pay | Admitting: Surgery

## 2022-08-09 LAB — SURGICAL PATHOLOGY

## 2022-09-05 ENCOUNTER — Encounter (HOSPITAL_COMMUNITY): Payer: Self-pay

## 2022-10-18 ENCOUNTER — Ambulatory Visit (INDEPENDENT_AMBULATORY_CARE_PROVIDER_SITE_OTHER): Payer: BC Managed Care – PPO

## 2022-10-18 ENCOUNTER — Ambulatory Visit (INDEPENDENT_AMBULATORY_CARE_PROVIDER_SITE_OTHER): Payer: BC Managed Care – PPO | Admitting: Family Medicine

## 2022-10-18 ENCOUNTER — Other Ambulatory Visit: Payer: Self-pay | Admitting: Family Medicine

## 2022-10-18 VITALS — BP 128/76 | HR 74 | Ht 62.0 in | Wt 141.0 lb

## 2022-10-18 DIAGNOSIS — S92355A Nondisplaced fracture of fifth metatarsal bone, left foot, initial encounter for closed fracture: Secondary | ICD-10-CM | POA: Diagnosis not present

## 2022-10-18 DIAGNOSIS — M25572 Pain in left ankle and joints of left foot: Secondary | ICD-10-CM | POA: Diagnosis not present

## 2022-10-18 DIAGNOSIS — S99192A Other physeal fracture of left metatarsal, initial encounter for closed fracture: Secondary | ICD-10-CM | POA: Diagnosis not present

## 2022-10-18 NOTE — Patient Instructions (Signed)
Thank you for coming in today.   You should hear from Dr Audrie Lia office shortly.   Keep the weight off the foot.   Use crutches or a knee scooter.   Use the boot.   Tylenol if ok. Add ibuprofen if needed.

## 2022-10-18 NOTE — Progress Notes (Signed)
   I, Philbert Riser, LAT, ATC acting as a scribe for Clementeen Graham, MD.  Subjective:    CC: L foot pain  HPI: PT is a 46 y/o female c/o L foot pain ongoing since Monday. MOI: Pt was going for a run and stepped on a hickory nut that was covered in leaves, and her L foot/ankle rolled into INV/supination. Pt locates pain to the dorsum and lateral aspect of the L foot.   L foot swelling: yes Aggravates: eversion, TTP Treatments tried: ice, elevation, IBU,  . She was just working into running again after recovering from a total hysterectomy.  Pertinent review of Systems: No fevers or chills  Relevant historical information: Lynch syndrome   Objective:    Vitals:   10/18/22 1358  BP: 128/76  Pulse: 74  SpO2: 99%   General: Well Developed, well nourished, and in no acute distress.   MSK: Left foot swollen and tender to palpation especially at the proximal fifth metatarsal.  Decreased ankle motion.  Pulses capillary fill and sensation are intact distally.  Lab and Radiology Results  X-ray images left foot obtained today personally and independently interpreted. Proximal fifth metatarsal fracture without displacement in the metaphysis region consistent appearance with a Jones fracture. Await formal radiology review    Impression and Recommendations:    Assessment and Plan: 46 y.o. female with left fifth proximal metatarsal fracture.  Fracture per my read is consistent in appearance with a Jones fracture.  She is very interested in returning to running as soon as possible.  Therefore will refer to orthopedic surgery for surgical opinion.  In the meantime treat with cam walker boot and nonweightbearing with crutches.  Recommend a knee scooter as well.  Her pain is currently pretty well-controlled with Tylenol and ibuprofen.  She will keep me updated if she has any challenges along the way.Marland Kitchen  PDMP not reviewed this encounter. Orders Placed This Encounter  Procedures    Ambulatory referral to Orthopedic Surgery    Referral Priority:   Routine    Referral Type:   Surgical    Referral Reason:   Specialty Services Required    Referred to Provider:   Nadara Mustard, MD    Requested Specialty:   Orthopedic Surgery    Number of Visits Requested:   1   No orders of the defined types were placed in this encounter.   Discussed warning signs or symptoms. Please see discharge instructions. Patient expresses understanding.   The above documentation has been reviewed and is accurate and complete Clementeen Graham, M.D.

## 2022-10-23 DIAGNOSIS — S92355A Nondisplaced fracture of fifth metatarsal bone, left foot, initial encounter for closed fracture: Secondary | ICD-10-CM | POA: Diagnosis not present

## 2022-10-24 NOTE — Progress Notes (Signed)
Left foot x-ray confirms a fracture at the fifth metatarsal like we talked about.  Do you have an appointment scheduled yet with orthopedic surgery?

## 2022-10-30 DIAGNOSIS — S92355D Nondisplaced fracture of fifth metatarsal bone, left foot, subsequent encounter for fracture with routine healing: Secondary | ICD-10-CM | POA: Diagnosis not present

## 2022-11-24 DIAGNOSIS — S92355D Nondisplaced fracture of fifth metatarsal bone, left foot, subsequent encounter for fracture with routine healing: Secondary | ICD-10-CM | POA: Diagnosis not present

## 2022-12-20 DIAGNOSIS — M6281 Muscle weakness (generalized): Secondary | ICD-10-CM | POA: Diagnosis not present

## 2022-12-20 DIAGNOSIS — R269 Unspecified abnormalities of gait and mobility: Secondary | ICD-10-CM | POA: Diagnosis not present

## 2022-12-20 DIAGNOSIS — M25675 Stiffness of left foot, not elsewhere classified: Secondary | ICD-10-CM | POA: Diagnosis not present

## 2022-12-20 DIAGNOSIS — S92355D Nondisplaced fracture of fifth metatarsal bone, left foot, subsequent encounter for fracture with routine healing: Secondary | ICD-10-CM | POA: Diagnosis not present

## 2022-12-25 DIAGNOSIS — S92355A Nondisplaced fracture of fifth metatarsal bone, left foot, initial encounter for closed fracture: Secondary | ICD-10-CM | POA: Diagnosis not present

## 2022-12-26 ENCOUNTER — Encounter: Payer: Self-pay | Admitting: Internal Medicine

## 2022-12-26 DIAGNOSIS — D649 Anemia, unspecified: Secondary | ICD-10-CM | POA: Insufficient documentation

## 2022-12-26 DIAGNOSIS — R739 Hyperglycemia, unspecified: Secondary | ICD-10-CM | POA: Insufficient documentation

## 2022-12-26 NOTE — Patient Instructions (Addendum)
Flu immunization administered today.     Blood work was ordered.   The lab is on the first floor.    Medications changes include :   None   A referral was ordered for ENT.     Return in about 1 year (around 12/28/2023) for Physical Exam.   Health Maintenance, Female Adopting a healthy lifestyle and getting preventive care are important in promoting health and wellness. Ask your health care provider about: The right schedule for you to have regular tests and exams. Things you can do on your own to prevent diseases and keep yourself healthy. What should I know about diet, weight, and exercise? Eat a healthy diet  Eat a diet that includes plenty of vegetables, fruits, low-fat dairy products, and lean protein. Do not eat a lot of foods that are high in solid fats, added sugars, or sodium. Maintain a healthy weight Body mass index (BMI) is used to identify weight problems. It estimates body fat based on height and weight. Your health care provider can help determine your BMI and help you achieve or maintain a healthy weight. Get regular exercise Get regular exercise. This is one of the most important things you can do for your health. Most adults should: Exercise for at least 150 minutes each week. The exercise should increase your heart rate and make you sweat (moderate-intensity exercise). Do strengthening exercises at least twice a week. This is in addition to the moderate-intensity exercise. Spend less time sitting. Even light physical activity can be beneficial. Watch cholesterol and blood lipids Have your blood tested for lipids and cholesterol at 49 years of age, then have this test every 5 years. Have your cholesterol levels checked more often if: Your lipid or cholesterol levels are high. You are older than 49 years of age. You are at high risk for heart disease. What should I know about cancer screening? Depending on your health history and family history, you may  need to have cancer screening at various ages. This may include screening for: Breast cancer. Cervical cancer. Colorectal cancer. Skin cancer. Lung cancer. What should I know about heart disease, diabetes, and high blood pressure? Blood pressure and heart disease High blood pressure causes heart disease and increases the risk of stroke. This is more likely to develop in people who have high blood pressure readings or are overweight. Have your blood pressure checked: Every 3-5 years if you are 80-15 years of age. Every year if you are 4 years old or older. Diabetes Have regular diabetes screenings. This checks your fasting blood sugar level. Have the screening done: Once every three years after age 56 if you are at a normal weight and have a low risk for diabetes. More often and at a younger age if you are overweight or have a high risk for diabetes. What should I know about preventing infection? Hepatitis B If you have a higher risk for hepatitis B, you should be screened for this virus. Talk with your health care provider to find out if you are at risk for hepatitis B infection. Hepatitis C Testing is recommended for: Everyone born from [birth year] through 1967. Anyone with known risk factors for hepatitis C. Sexually transmitted infections (STIs) Get screened for STIs, including gonorrhea and chlamydia, if: You are sexually active and are younger than 49 years of age. You are older than 49 years of age and your health care provider tells you that you are at risk for this type of  infection. Your sexual activity has changed since you were last screened, and you are at increased risk for chlamydia or gonorrhea. Ask your health care provider if you are at risk. Ask your health care provider about whether you are at high risk for HIV. Your health care provider may recommend a prescription medicine to help prevent HIV infection. If you choose to take medicine to prevent HIV, you should first get  tested for HIV. You should then be tested every 3 months for as long as you are taking the medicine. Pregnancy If you are about to stop having your period (premenopausal) and you may become pregnant, seek counseling before you get pregnant. Take 400 to 800 micrograms (mcg) of folic acid every day if you become pregnant. Ask for birth control (contraception) if you want to prevent pregnancy. Osteoporosis and menopause Osteoporosis is a disease in which the bones lose minerals and strength with aging. This can result in bone fractures. If you are 68 years old or older, or if you are at risk for osteoporosis and fractures, ask your health care provider if you should: Be screened for bone loss. Take a calcium or vitamin D supplement to lower your risk of fractures. Be given hormone replacement therapy (HRT) to treat symptoms of menopause. Follow these instructions at home: Alcohol use Do not drink alcohol if: Your health care provider tells you not to drink. You are pregnant, may be pregnant, or are planning to become pregnant. If you drink alcohol: Limit how much you have to: 0-1 drink a day. Know how much alcohol is in your drink. In the [place] one drink equals one 12 oz bottle of beer (355 mL), one 5 oz glass of wine (148 mL), or one 1 oz glass of hard liquor (44 mL). Lifestyle Do not use any products that contain nicotine or tobacco. These products include cigarettes, chewing tobacco, and vaping devices, such as e-cigarettes. If you need help quitting, ask your health care provider. Do not use street drugs. Do not share needles. Ask your health care provider for help if you need support or information about quitting drugs. General instructions Schedule regular health, dental, and eye exams. Stay current with your vaccines. Tell your health care provider if: You often feel depressed. You have ever been abused or do not feel safe at home. Summary Adopting a healthy lifestyle and getting  preventive care are important in promoting health and wellness. Follow your health care provider's instructions about healthy diet, exercising, and getting tested or screened for diseases. Follow your health care provider's instructions on monitoring your cholesterol and blood pressure. This information is not intended to replace advice given to you by your health care provider. Make sure you discuss any questions you have with your health care provider. Document Revised: 04/04/2021 Document Reviewed: 04/04/2021 Elsevier Patient Education  [employer].

## 2022-12-26 NOTE — Progress Notes (Unsigned)
Subjective:    Patient ID: Lauren Reynolds, female    DOB: Jan 10, 1976, 47 y.o.   MRN: [id number]      HPI Lauren Reynolds is here for a Physical exam.    S/p TAH one year ago.   Still dealing with slipped disc.  Does back exercises and it helps.  No significant pain at this time-typically gets heaviness in her legs.  Felt like she had water in her right ear.  Did neti pot a couple of times and her ear popped.   Since then tinnitus worse.  She has had tinnitus for a while.  She does neti pot on occasion.  Has drainage.   Ears clogged at times. Has some seasonal allergies.  Taking 1/2 zyrtec nightly.  She admits she may have some mild hearing loss.    Medications and allergies reviewed with patient and updated if appropriate.  Current Outpatient Medications on File Prior to Visit  Medication Sig Dispense Refill   Cetirizine HCl (ZYRTEC ALLERGY PO) Take by mouth.     Cholecalciferol (VITAMIN D) 2000 units CAPS Take 1 capsule by mouth daily.     Cyanocobalamin (B-12 PO) Take by mouth.     Iodine, Kelp, (KELP PO) Take by mouth daily.     magnesium 30 MG tablet Take 30 mg by mouth 2 (two) times daily.     metroNIDAZOLE (METROCREAM) 0.75 % cream Apply topically daily. PRN     NON FORMULARY "Various mushrooms" per patient     Omega-3 Fatty Acids (OMEGA-3 FISH OIL CONCENTRATE PO) Take by mouth.     Turmeric (QC TUMERIC COMPLEX PO) Take by mouth.     No current facility-administered medications on file prior to visit.    Review of Systems  Constitutional:  Negative for fever.  HENT:  Positive for postnasal drip and tinnitus (R > L ear).   Eyes:  Negative for visual disturbance.  Respiratory:  Negative for cough, shortness of breath and wheezing.   Cardiovascular:  Negative for chest pain, palpitations and leg swelling.  Gastrointestinal:  Negative for abdominal pain, blood in stool, constipation, diarrhea and nausea.       Occ gerd  Genitourinary:  Negative for dysuria.   Musculoskeletal:  Negative for arthralgias and back pain.  Skin:  Negative for rash.  Neurological:  Negative for light-headedness and headaches.  Psychiatric/Behavioral:  Negative for dysphoric mood. The patient is not nervous/anxious.        Objective:   Vitals:   12/27/22 1010  BP: 106/78  Pulse: 70  Temp: 98.8 F (37.1 C)  SpO2: 99%   Filed Weights   12/27/22 1010  Weight: 137 lb (62.1 kg)   Body mass index is 25.06 kg/m.  BP Readings from Last 3 Encounters:  12/27/22 106/78  10/18/22 128/76  08/08/22 127/64    Wt Readings from Last 3 Encounters:  12/27/22 137 lb (62.1 kg)  10/18/22 141 lb (64 kg)  08/08/22 141 lb 8.6 oz (64.2 kg)       Physical Exam Constitutional: She appears well-developed and well-nourished. No distress.  HENT:  Head: Normocephalic and atraumatic.  Right Ear: External ear normal. Normal ear canal and TM Left Ear: External ear normal.  Normal ear canal and TM Mouth/Throat: Oropharynx is clear and moist.  Eyes: Conjunctivae normal.  Neck: Neck supple. No tracheal deviation present. No thyromegaly present.  No carotid bruit  Cardiovascular: Normal rate, regular rhythm and normal heart sounds.   No murmur heard.  No  edema. Pulmonary/Chest: Effort normal and breath sounds normal. No respiratory distress. She has no wheezes. She has no rales.  Breast: deferred   Abdominal: Soft. She exhibits no distension. There is no tenderness.  Lymphadenopathy: She has no cervical adenopathy.  Skin: Skin is warm and dry. She is not diaphoretic.  Psychiatric: She has a normal mood and affect. Her behavior is normal.     Lab Results  Component Value Date   WBC 9.0 12/16/2021   HGB 10.3 (L) 12/16/2021   HCT 30.7 (L) 12/16/2021   PLT 237 12/16/2021   GLUCOSE 97 04/07/2021   CHOL 256 (H) 12/27/2020   TRIG 66.0 12/27/2020   HDL 84.90 12/27/2020   LDLCALC 157 (H) 12/27/2020   ALT 11 04/07/2021   AST 16 04/07/2021   NA 139 04/07/2021   K 4.8  04/07/2021   CL 105 04/07/2021   CREATININE 0.78 04/07/2021   BUN 12 04/07/2021   CO2 27 04/07/2021   TSH 3.02 03/28/2021    The 10-year ASCVD risk score (Arnett DK, et al., 2019) is: 0.6%   Values used to calculate the score:     Age: 19 years     Sex: Female     Is Non-Hispanic African American: No     Diabetic: No     Tobacco smoker: No     Systolic Blood Pressure: 702 mmHg     Is BP treated: No     HDL Cholesterol: 84.9 mg/dL     Total Cholesterol: 256 mg/dL      Assessment & Plan:   Physical exam: Screening blood work  ordered Exercise  walking Weight  normal Substance abuse  none   Reviewed recommended immunizations.   Health Maintenance  Topic Date Due   PAP SMEAR-Modifier  Never done   MAMMOGRAM  12/15/2020   INFLUENZA VACCINE  06/27/2022   COVID-19 Vaccine (5 - 2023-24 season) 07/28/2022   DTaP/Tdap/Td (2 - Td or Tdap) 08/22/2025   COLONOSCOPY (Pts 45-10yrs Insurance coverage will need to be confirmed)  06/16/2031   Hepatitis C Screening  Completed   HIV Screening  Completed   HPV VACCINES  Aged Out          See Problem List for Assessment and Plan of chronic medical problems.

## 2022-12-27 ENCOUNTER — Ambulatory Visit (INDEPENDENT_AMBULATORY_CARE_PROVIDER_SITE_OTHER): Payer: BC Managed Care – PPO | Admitting: Internal Medicine

## 2022-12-27 VITALS — BP 106/78 | HR 70 | Temp 98.8°F | Ht 62.0 in | Wt 137.0 lb

## 2022-12-27 DIAGNOSIS — S92355D Nondisplaced fracture of fifth metatarsal bone, left foot, subsequent encounter for fracture with routine healing: Secondary | ICD-10-CM | POA: Diagnosis not present

## 2022-12-27 DIAGNOSIS — Z23 Encounter for immunization: Secondary | ICD-10-CM | POA: Diagnosis not present

## 2022-12-27 DIAGNOSIS — R269 Unspecified abnormalities of gait and mobility: Secondary | ICD-10-CM | POA: Diagnosis not present

## 2022-12-27 DIAGNOSIS — E782 Mixed hyperlipidemia: Secondary | ICD-10-CM

## 2022-12-27 DIAGNOSIS — Z789 Other specified health status: Secondary | ICD-10-CM | POA: Diagnosis not present

## 2022-12-27 DIAGNOSIS — H9313 Tinnitus, bilateral: Secondary | ICD-10-CM

## 2022-12-27 DIAGNOSIS — M25675 Stiffness of left foot, not elsewhere classified: Secondary | ICD-10-CM | POA: Diagnosis not present

## 2022-12-27 DIAGNOSIS — Z0000 Encounter for general adult medical examination without abnormal findings: Secondary | ICD-10-CM

## 2022-12-27 DIAGNOSIS — R739 Hyperglycemia, unspecified: Secondary | ICD-10-CM

## 2022-12-27 DIAGNOSIS — M6281 Muscle weakness (generalized): Secondary | ICD-10-CM | POA: Diagnosis not present

## 2022-12-27 LAB — CBC WITH DIFFERENTIAL/PLATELET
Basophils Absolute: 0 10*3/uL (ref 0.0–0.1)
Basophils Relative: 0.3 % (ref 0.0–3.0)
Eosinophils Absolute: 0.2 10*3/uL (ref 0.0–0.7)
Eosinophils Relative: 3 % (ref 0.0–5.0)
HCT: 36 % (ref 36.0–46.0)
Hemoglobin: 12.5 g/dL (ref 12.0–15.0)
Lymphocytes Relative: 23.7 % (ref 12.0–46.0)
Lymphs Abs: 1.5 10*3/uL (ref 0.7–4.0)
MCHC: 34.8 g/dL (ref 30.0–36.0)
MCV: 85.1 fl (ref 78.0–100.0)
Monocytes Absolute: 0.4 10*3/uL (ref 0.1–1.0)
Monocytes Relative: 6.1 % (ref 3.0–12.0)
Neutro Abs: 4.3 10*3/uL (ref 1.4–7.7)
Neutrophils Relative %: 66.9 % (ref 43.0–77.0)
Platelets: 265 10*3/uL (ref 150.0–400.0)
RBC: 4.23 Mil/uL (ref 3.87–5.11)
RDW: 13.3 % (ref 11.5–15.5)
WBC: 6.4 10*3/uL (ref 4.0–10.5)

## 2022-12-27 LAB — LIPID PANEL
Cholesterol: 239 mg/dL — ABNORMAL HIGH (ref 0–200)
HDL: 63.9 mg/dL (ref >39.00)
LDL Cholesterol: 155 mg/dL — ABNORMAL HIGH (ref 0–99)
NonHDL: 174.77
Total CHOL/HDL Ratio: 4
Triglycerides: 97 mg/dL (ref 0.0–149.0)
VLDL: 19.4 mg/dL (ref 0.0–40.0)

## 2022-12-27 LAB — COMPREHENSIVE METABOLIC PANEL
ALT: 13 U/L (ref 0–35)
AST: 17 U/L (ref 0–37)
Albumin: 4.7 g/dL (ref 3.5–5.2)
Alkaline Phosphatase: 68 U/L (ref 39–117)
BUN: 8 mg/dL (ref 6–23)
CO2: 30 mEq/L (ref 19–32)
Calcium: 9.7 mg/dL (ref 8.4–10.5)
Chloride: 102 mEq/L (ref 96–112)
Creatinine, Ser: 0.73 mg/dL (ref 0.40–1.20)
GFR: 97 mL/min (ref >60.00)
Glucose, Bld: 79 mg/dL (ref 70–99)
Potassium: 3.9 mEq/L (ref 3.5–5.1)
Sodium: 140 mEq/L (ref 135–145)
Total Bilirubin: 0.5 mg/dL (ref 0.2–1.2)
Total Protein: 7.7 g/dL (ref 6.0–8.3)

## 2022-12-27 LAB — TSH: TSH: 2.03 u[IU]/mL (ref 0.35–5.50)

## 2022-12-27 LAB — VITAMIN D 25 HYDROXY (VIT D DEFICIENCY, FRACTURES): VITD: 45.82 ng/mL (ref 30.00–100.00)

## 2022-12-27 LAB — VITAMIN B12: Vitamin B-12: 502 pg/mL (ref 211–911)

## 2022-12-27 LAB — HEMOGLOBIN A1C: Hgb A1c MFr Bld: 5.4 % (ref 4.6–6.5)

## 2022-12-27 NOTE — Assessment & Plan Note (Signed)
Chronic Sugars have been on the low high side of normal Check A1c

## 2022-12-27 NOTE — Assessment & Plan Note (Signed)
Chronic Course in the past few months Possibly sudden hearing loss No other obvious cause for this Will refer to ENT for further evaluation Will likely then go back to for acupuncture

## 2022-12-27 NOTE — Assessment & Plan Note (Signed)
Has been sticking to a vegan diet Check vitamin D level and vitamin B12 level

## 2022-12-27 NOTE — Addendum Note (Signed)
Addended by: Marcina Millard on: 12/27/2022 04:07 PM   Modules accepted: Orders

## 2022-12-27 NOTE — Assessment & Plan Note (Addendum)
Chronic Has gone vegan which will likely improve her lipid panel Regular exercise and healthy diet encouraged Check lipid panel, CMP, TSH Continue lifestyle control

## 2022-12-27 NOTE — Assessment & Plan Note (Deleted)
Chronic Now vegan Check lipid panel  Continue lifestyle control Regular exercise and healthy diet encouraged

## 2023-01-03 DIAGNOSIS — M6281 Muscle weakness (generalized): Secondary | ICD-10-CM | POA: Diagnosis not present

## 2023-01-03 DIAGNOSIS — R269 Unspecified abnormalities of gait and mobility: Secondary | ICD-10-CM | POA: Diagnosis not present

## 2023-01-03 DIAGNOSIS — M25675 Stiffness of left foot, not elsewhere classified: Secondary | ICD-10-CM | POA: Diagnosis not present

## 2023-01-03 DIAGNOSIS — S92355D Nondisplaced fracture of fifth metatarsal bone, left foot, subsequent encounter for fracture with routine healing: Secondary | ICD-10-CM | POA: Diagnosis not present

## 2023-01-11 ENCOUNTER — Encounter: Payer: Self-pay | Admitting: Internal Medicine

## 2023-01-11 NOTE — Progress Notes (Signed)
Outside notes received. Information abstracted. Notes sent to scan.  

## 2023-01-15 DIAGNOSIS — M6281 Muscle weakness (generalized): Secondary | ICD-10-CM | POA: Diagnosis not present

## 2023-01-15 DIAGNOSIS — R269 Unspecified abnormalities of gait and mobility: Secondary | ICD-10-CM | POA: Diagnosis not present

## 2023-01-15 DIAGNOSIS — M25675 Stiffness of left foot, not elsewhere classified: Secondary | ICD-10-CM | POA: Diagnosis not present

## 2023-01-15 DIAGNOSIS — S92355D Nondisplaced fracture of fifth metatarsal bone, left foot, subsequent encounter for fracture with routine healing: Secondary | ICD-10-CM | POA: Diagnosis not present

## 2023-01-31 DIAGNOSIS — S92355D Nondisplaced fracture of fifth metatarsal bone, left foot, subsequent encounter for fracture with routine healing: Secondary | ICD-10-CM | POA: Diagnosis not present

## 2023-03-08 DIAGNOSIS — L821 Other seborrheic keratosis: Secondary | ICD-10-CM | POA: Diagnosis not present

## 2023-03-08 DIAGNOSIS — D2222 Melanocytic nevi of left ear and external auricular canal: Secondary | ICD-10-CM | POA: Diagnosis not present

## 2023-03-08 DIAGNOSIS — D2262 Melanocytic nevi of left upper limb, including shoulder: Secondary | ICD-10-CM | POA: Diagnosis not present

## 2023-03-08 DIAGNOSIS — D224 Melanocytic nevi of scalp and neck: Secondary | ICD-10-CM | POA: Diagnosis not present

## 2023-05-22 ENCOUNTER — Encounter: Payer: Self-pay | Admitting: Internal Medicine

## 2023-05-23 MED ORDER — SCOPOLAMINE 1 MG/3DAYS TD PT72: 1.0000 | MEDICATED_PATCH | TRANSDERMAL | 0 refills | Status: DC

## 2023-06-25 DIAGNOSIS — H90A32 Mixed conductive and sensorineural hearing loss, unilateral, left ear with restricted hearing on the contralateral side: Secondary | ICD-10-CM | POA: Diagnosis not present

## 2023-06-25 DIAGNOSIS — H9313 Tinnitus, bilateral: Secondary | ICD-10-CM | POA: Diagnosis not present

## 2023-08-23 DIAGNOSIS — L820 Inflamed seborrheic keratosis: Secondary | ICD-10-CM | POA: Diagnosis not present

## 2023-10-30 DIAGNOSIS — Z01419 Encounter for gynecological examination (general) (routine) without abnormal findings: Secondary | ICD-10-CM | POA: Diagnosis not present

## 2023-10-30 DIAGNOSIS — Z13220 Encounter for screening for lipoid disorders: Secondary | ICD-10-CM | POA: Diagnosis not present

## 2023-10-30 DIAGNOSIS — Z6825 Body mass index (BMI) 25.0-25.9, adult: Secondary | ICD-10-CM | POA: Diagnosis not present

## 2023-10-30 DIAGNOSIS — Z1231 Encounter for screening mammogram for malignant neoplasm of breast: Secondary | ICD-10-CM | POA: Diagnosis not present

## 2023-10-30 DIAGNOSIS — Z1151 Encounter for screening for human papillomavirus (HPV): Secondary | ICD-10-CM | POA: Diagnosis not present

## 2023-10-30 DIAGNOSIS — Z1272 Encounter for screening for malignant neoplasm of vagina: Secondary | ICD-10-CM | POA: Diagnosis not present

## 2023-10-30 LAB — HM MAMMOGRAPHY

## 2023-10-31 ENCOUNTER — Other Ambulatory Visit: Payer: Self-pay | Admitting: Obstetrics and Gynecology

## 2023-10-31 DIAGNOSIS — Z1239 Encounter for other screening for malignant neoplasm of breast: Secondary | ICD-10-CM

## 2023-10-31 LAB — HM PAP SMEAR: HPV, high-risk: NEGATIVE

## 2023-12-15 ENCOUNTER — Ambulatory Visit
Admission: RE | Admit: 2023-12-15 | Discharge: 2023-12-15 | Disposition: A | Discharge status: Home / Self Care | Payer: BC Managed Care – PPO | Source: Ambulatory Visit | Attending: Obstetrics and Gynecology | Admitting: Obstetrics and Gynecology

## 2023-12-15 DIAGNOSIS — Z1239 Encounter for other screening for malignant neoplasm of breast: Secondary | ICD-10-CM

## 2023-12-15 MED ORDER — GADOPICLENOL 0.5 MMOL/ML IV SOLN
6.0000 mL | Freq: Once | INTRAVENOUS | Status: AC | PRN
  Administered 2023-12-15: 6 mL via INTRAVENOUS

## 2023-12-31 DIAGNOSIS — M25562 Pain in left knee: Secondary | ICD-10-CM | POA: Diagnosis not present

## 2024-06-24 DIAGNOSIS — N760 Acute vaginitis: Secondary | ICD-10-CM | POA: Diagnosis not present

## 2024-06-24 DIAGNOSIS — R300 Dysuria: Secondary | ICD-10-CM | POA: Diagnosis not present

## 2024-07-10 ENCOUNTER — Encounter: Payer: Self-pay | Admitting: Internal Medicine

## 2024-07-10 ENCOUNTER — Telehealth: Payer: Self-pay | Admitting: Gastroenterology

## 2024-07-10 DIAGNOSIS — D2261 Melanocytic nevi of right upper limb, including shoulder: Secondary | ICD-10-CM | POA: Diagnosis not present

## 2024-07-10 DIAGNOSIS — D2262 Melanocytic nevi of left upper limb, including shoulder: Secondary | ICD-10-CM | POA: Diagnosis not present

## 2024-07-10 DIAGNOSIS — D2222 Melanocytic nevi of left ear and external auricular canal: Secondary | ICD-10-CM | POA: Diagnosis not present

## 2024-07-10 DIAGNOSIS — D224 Melanocytic nevi of scalp and neck: Secondary | ICD-10-CM | POA: Diagnosis not present

## 2024-07-10 DIAGNOSIS — Z1211 Encounter for screening for malignant neoplasm of colon: Secondary | ICD-10-CM

## 2024-07-10 NOTE — Telephone Encounter (Signed)
 Goodmorning Dr.Nandigam   Patient called wanting to schedule colonoscopy but recall states she is not due until 05/2031, patient stated she doesn't understand why if she was told she needs to schedule a colonoscopy once a year. I advised patient she was up for an EDG but not yet a colonoscopy.  Please advise for future scheduling  Thank you

## 2024-07-11 NOTE — Telephone Encounter (Signed)
 Good Afternoon Dr.Nandigam   Reached out to patient to schedule EGD and colonoscopy, patient stated she will call back to schedule.  Thank you

## 2024-07-11 NOTE — Telephone Encounter (Signed)
 Please schedule both EGD and colonoscopy next available appointment, she is scheduled for surveillance EGD and colonoscopy due to history of Lynch syndrome.  Thank you

## 2024-07-29 DIAGNOSIS — K429 Umbilical hernia without obstruction or gangrene: Secondary | ICD-10-CM | POA: Diagnosis not present

## 2024-07-29 DIAGNOSIS — K824 Cholesterolosis of gallbladder: Secondary | ICD-10-CM | POA: Diagnosis not present

## 2024-07-29 DIAGNOSIS — Z1509 Genetic susceptibility to other malignant neoplasm: Secondary | ICD-10-CM | POA: Diagnosis not present

## 2024-07-29 DIAGNOSIS — Z1211 Encounter for screening for malignant neoplasm of colon: Secondary | ICD-10-CM | POA: Diagnosis not present

## 2024-07-31 DIAGNOSIS — K429 Umbilical hernia without obstruction or gangrene: Secondary | ICD-10-CM | POA: Diagnosis not present

## 2024-07-31 DIAGNOSIS — Z1211 Encounter for screening for malignant neoplasm of colon: Secondary | ICD-10-CM | POA: Diagnosis not present

## 2024-07-31 DIAGNOSIS — Z1509 Genetic susceptibility to other malignant neoplasm: Secondary | ICD-10-CM | POA: Diagnosis not present

## 2024-07-31 DIAGNOSIS — K824 Cholesterolosis of gallbladder: Secondary | ICD-10-CM | POA: Diagnosis not present

## 2024-08-18 DIAGNOSIS — K648 Other hemorrhoids: Secondary | ICD-10-CM | POA: Diagnosis not present

## 2024-08-18 DIAGNOSIS — Z1211 Encounter for screening for malignant neoplasm of colon: Secondary | ICD-10-CM | POA: Diagnosis not present

## 2024-08-18 DIAGNOSIS — K2901 Acute gastritis with bleeding: Secondary | ICD-10-CM | POA: Diagnosis not present

## 2024-08-18 DIAGNOSIS — K319 Disease of stomach and duodenum, unspecified: Secondary | ICD-10-CM | POA: Diagnosis not present

## 2024-08-18 DIAGNOSIS — K6389 Other specified diseases of intestine: Secondary | ICD-10-CM | POA: Diagnosis not present

## 2024-08-18 DIAGNOSIS — Z1509 Genetic susceptibility to other malignant neoplasm: Secondary | ICD-10-CM | POA: Diagnosis not present

## 2024-08-18 DIAGNOSIS — K254 Chronic or unspecified gastric ulcer with hemorrhage: Secondary | ICD-10-CM | POA: Diagnosis not present

## 2024-08-18 LAB — HM COLONOSCOPY

## 2024-09-30 DIAGNOSIS — N390 Urinary tract infection, site not specified: Secondary | ICD-10-CM | POA: Diagnosis not present

## 2024-09-30 DIAGNOSIS — R350 Frequency of micturition: Secondary | ICD-10-CM | POA: Diagnosis not present

## 2024-10-12 ENCOUNTER — Encounter: Payer: Self-pay | Admitting: Internal Medicine

## 2024-10-12 NOTE — Progress Notes (Unsigned)
 Subjective:    Patient ID: Lauren Reynolds, female    DOB: Dec 11, 1975, 48 y.o.   MRN: [id number]      HPI Lauren Reynolds is here for a Physical exam and her chronic medical problems.   Urge incontinence - new.   - did mona lisa touch her vaginal dryness is shortly after that her urinary stream changed and she thinks she may have passed a stone-possibly a uric acid stone.  She has had urinary frequency and urgency since then.  She is now on estrogen cream, but has not seen much benefit from that.     Medications and allergies reviewed with patient and updated if appropriate.  Current Outpatient Medications on File Prior to Visit  Medication Sig Dispense Refill   Cetirizine HCl (ZYRTEC ALLERGY PO) Take by mouth.     Cholecalciferol (VITAMIN D ) 2000 units CAPS Take 1 capsule by mouth daily.     Cyanocobalamin  (B-12 PO) Take by mouth.     estradiol (ESTRACE) 0.01 % CREA vaginal cream Insert 1 g twice a week by vaginal route at bedtime, for and apply as directed.     metroNIDAZOLE (METROCREAM) 0.75 % cream Apply topically as needed. PRN     NON FORMULARY Various mushrooms per patient     Turmeric (QC TUMERIC COMPLEX PO) Take by mouth.     Omega-3 Fatty Acids (OMEGA-3 FISH OIL CONCENTRATE PO) Take by mouth. (Patient not taking: Reported on 10/15/2024)     No current facility-administered medications on file prior to visit.    Review of Systems  Constitutional:  Negative for fever.  Eyes:  Negative for visual disturbance.  Respiratory:  Negative for cough, shortness of breath and wheezing.   Cardiovascular:  Negative for chest pain, palpitations and leg swelling.  Gastrointestinal:  Negative for abdominal pain, blood in stool, constipation and diarrhea.       Occ gerd  Genitourinary:  Positive for frequency and urgency. Negative for dysuria.       Bladder pressure  Musculoskeletal:  Negative for arthralgias and back pain.  Skin:  Negative for rash.  Neurological:  Negative for  light-headedness and headaches.  Psychiatric/Behavioral:  Negative for dysphoric mood. The patient is not nervous/anxious.        Objective:   Vitals:   10/15/24 1056  BP: 116/78  Pulse: 64  Temp: 98.2 F (36.8 C)  SpO2: 98%   Filed Weights   10/15/24 1056  Weight: 141 lb (64 kg)   Body mass index is 25.79 kg/m.  BP Readings from Last 3 Encounters:  10/15/24 116/78  12/27/22 106/78  10/18/22 128/76    Wt Readings from Last 3 Encounters:  10/15/24 141 lb (64 kg)  12/27/22 137 lb (62.1 kg)  10/18/22 141 lb (64 kg)       Physical Exam Constitutional: She appears well-developed and well-nourished. No distress.  HENT:  Head: Normocephalic and atraumatic.  Right Ear: External ear normal. Normal ear canal and TM Left Ear: External ear normal.  Normal ear canal and TM Mouth/Throat: Oropharynx is clear and moist.  Eyes: Conjunctivae normal.  Neck: Neck supple. No tracheal deviation present. No thyromegaly present.  No carotid bruit  Cardiovascular: Normal rate, regular rhythm and normal heart sounds.   No murmur heard.  No edema. Pulmonary/Chest: Effort normal and breath sounds normal. No respiratory distress. She has no wheezes. She has no rales.  Breast: deferred   Abdominal: Soft. She exhibits no distension. There is no tenderness.  Lymphadenopathy:  She has no cervical adenopathy.  Skin: Skin is warm and dry. She is not diaphoretic.  Psychiatric: She has a normal mood and affect. Her behavior is normal.     Lab Results  Component Value Date   WBC 6.4 12/27/2022   HGB 12.5 12/27/2022   HCT 36.0 12/27/2022   PLT 265.0 12/27/2022   GLUCOSE 79 12/27/2022   CHOL 239 (H) 12/27/2022   TRIG 97.0 12/27/2022   HDL 63.90 12/27/2022   LDLCALC 155 (H) 12/27/2022   ALT 13 12/27/2022   AST 17 12/27/2022   NA 140 12/27/2022   K 3.9 12/27/2022   CL 102 12/27/2022   CREATININE 0.73 12/27/2022   BUN 8 12/27/2022   CO2 30 12/27/2022   TSH 2.03 12/27/2022   HGBA1C  5.4 12/27/2022         Assessment & Plan:   Physical exam: Screening blood work  ordered Exercise  walking the dog, cardio 3/week, strength 3/week, yoga Weight  normal Substance abuse  none   Reviewed recommended immunizations.   Health Maintenance  Topic Date Due   Pneumococcal Vaccine: 50+ Years (1 of 1 - PCV) Never done   Zoster Vaccines- Shingrix (1 of 2) Never done   Influenza Vaccine  06/27/2024   Mammogram  12/14/2024   COVID-19 Vaccine (5 - 2025-26 season) 10/28/2024 (Originally 07/28/2024)   Hepatitis B Vaccines 19-59 Average Risk (1 of 3 - 19+ 3-dose series) 10/15/2025 (Originally 03/17/1993)   DTaP/Tdap/Td (2 - Td or Tdap) 08/22/2025   Cervical Cancer Screening (HPV/Pap Cotest)  10/30/2028   Colonoscopy  08/18/2034   Hepatitis C Screening  Completed   HIV Screening  Completed   HPV VACCINES  Aged Out   Meningococcal B Vaccine  Aged Out          See Problem List for Assessment and Plan of chronic medical problems.

## 2024-10-12 NOTE — Patient Instructions (Addendum)

## 2024-10-15 ENCOUNTER — Ambulatory Visit (INDEPENDENT_AMBULATORY_CARE_PROVIDER_SITE_OTHER): Admitting: Internal Medicine

## 2024-10-15 VITALS — BP 116/78 | HR 64 | Temp 98.2°F | Ht 62.0 in | Wt 141.0 lb

## 2024-10-15 DIAGNOSIS — N369 Urethral disorder, unspecified: Secondary | ICD-10-CM

## 2024-10-15 DIAGNOSIS — R739 Hyperglycemia, unspecified: Secondary | ICD-10-CM

## 2024-10-15 DIAGNOSIS — Z0000 Encounter for general adult medical examination without abnormal findings: Secondary | ICD-10-CM | POA: Diagnosis not present

## 2024-10-15 DIAGNOSIS — E7800 Pure hypercholesterolemia, unspecified: Secondary | ICD-10-CM | POA: Diagnosis not present

## 2024-10-15 DIAGNOSIS — R051 Acute cough: Secondary | ICD-10-CM

## 2024-10-15 DIAGNOSIS — Z136 Encounter for screening for cardiovascular disorders: Secondary | ICD-10-CM

## 2024-10-15 LAB — LIPID PANEL
Cholesterol: 248 mg/dL — ABNORMAL HIGH (ref 0–200)
HDL: 75.3 mg/dL (ref >39.00)
LDL Cholesterol: 159 mg/dL — ABNORMAL HIGH (ref 0–99)
NonHDL: 172.21
Total CHOL/HDL Ratio: 3
Triglycerides: 65 mg/dL (ref 0.0–149.0)
VLDL: 13 mg/dL (ref 0.0–40.0)

## 2024-10-15 LAB — COMPREHENSIVE METABOLIC PANEL WITH GFR
ALT: 16 U/L (ref 0–35)
AST: 22 U/L (ref 0–37)
Albumin: 4.7 g/dL (ref 3.5–5.2)
Alkaline Phosphatase: 67 U/L (ref 39–117)
BUN: 8 mg/dL (ref 6–23)
CO2: 30 meq/L (ref 19–32)
Calcium: 9.6 mg/dL (ref 8.4–10.5)
Chloride: 103 meq/L (ref 96–112)
Creatinine, Ser: 0.74 mg/dL (ref 0.40–1.20)
GFR: 94.23 mL/min (ref >60.00)
Glucose, Bld: 84 mg/dL (ref 70–99)
Potassium: 4 meq/L (ref 3.5–5.1)
Sodium: 141 meq/L (ref 135–145)
Total Bilirubin: 0.5 mg/dL (ref 0.2–1.2)
Total Protein: 7.7 g/dL (ref 6.0–8.3)

## 2024-10-15 LAB — CBC
HCT: 36.3 % (ref 36.0–46.0)
Hemoglobin: 12.2 g/dL (ref 12.0–15.0)
MCHC: 33.8 g/dL (ref 30.0–36.0)
MCV: 87.5 fl (ref 78.0–100.0)
Platelets: 272 K/uL (ref 150.0–400.0)
RBC: 4.14 Mil/uL (ref 3.87–5.11)
RDW: 13.2 % (ref 11.5–15.5)
WBC: 4.2 K/uL (ref 4.0–10.5)

## 2024-10-15 LAB — TSH: TSH: 3.41 u[IU]/mL (ref 0.35–5.50)

## 2024-10-15 NOTE — Addendum Note (Signed)
 Addended by: CLAUDENE TOBIAS PARAS on: 10/15/2024 02:30 PM   Modules accepted: Orders

## 2024-10-15 NOTE — Assessment & Plan Note (Signed)
 New Since having Lauren Reynolds procedure she has had some pressure in her urethra with increased urinary frequency and urgency Currently on vaginal estrogen topically at the urethra She may have passed a stone at 1 point ?  Urethral stricture versus other Referral to urology

## 2024-10-15 NOTE — Assessment & Plan Note (Addendum)
 Chronic Sugars have been on the high side of normal Check A1c

## 2024-10-15 NOTE — Assessment & Plan Note (Addendum)
 Chronic Likely genetic in nature Vegan She is exercising regularly and eating very healthy   Check lipid panel, CMP, TSH, CBC Check lipoprotein a Will get CT CAC Would like to avoid medication Continue lifestyle control

## 2024-10-16 ENCOUNTER — Ambulatory Visit: Payer: Self-pay | Admitting: Internal Medicine

## 2024-10-16 ENCOUNTER — Other Ambulatory Visit: Payer: Self-pay | Admitting: Urology

## 2024-10-16 DIAGNOSIS — R3915 Urgency of urination: Secondary | ICD-10-CM | POA: Diagnosis not present

## 2024-10-16 DIAGNOSIS — E7841 Elevated Lipoprotein(a): Secondary | ICD-10-CM

## 2024-10-16 DIAGNOSIS — R109 Unspecified abdominal pain: Secondary | ICD-10-CM | POA: Diagnosis not present

## 2024-10-16 DIAGNOSIS — N201 Calculus of ureter: Secondary | ICD-10-CM | POA: Diagnosis not present

## 2024-10-16 DIAGNOSIS — N134 Hydroureter: Secondary | ICD-10-CM | POA: Diagnosis not present

## 2024-10-16 DIAGNOSIS — N132 Hydronephrosis with renal and ureteral calculous obstruction: Secondary | ICD-10-CM | POA: Diagnosis not present

## 2024-10-16 DIAGNOSIS — R1084 Generalized abdominal pain: Secondary | ICD-10-CM | POA: Diagnosis not present

## 2024-10-16 LAB — HEMOGLOBIN A1C: Hgb A1c MFr Bld: 5.3 % (ref 4.6–6.5)

## 2024-10-17 ENCOUNTER — Encounter (HOSPITAL_COMMUNITY): Payer: Self-pay | Admitting: Urology

## 2024-10-17 NOTE — Progress Notes (Signed)
 Attempted to obtain medical history for pre op call via telephone, unable to reach at this time. HIPAA compliant voicemail message left requesting return call to pre surgical testing department.

## 2024-10-18 LAB — LIPOPROTEIN A (LPA): Lipoprotein (a): 455 nmol/L — ABNORMAL HIGH (ref <75)

## 2024-10-19 ENCOUNTER — Encounter: Payer: Self-pay | Admitting: Internal Medicine

## 2024-10-19 DIAGNOSIS — E7841 Elevated Lipoprotein(a): Secondary | ICD-10-CM | POA: Insufficient documentation

## 2024-10-20 ENCOUNTER — Encounter (HOSPITAL_COMMUNITY): Admission: RE | Payer: Self-pay | Source: Home / Self Care

## 2024-10-20 ENCOUNTER — Ambulatory Visit (HOSPITAL_COMMUNITY): Admission: RE | Admit: 2024-10-20 | Source: Home / Self Care | Admitting: Urology

## 2024-10-20 SURGERY — LITHOTRIPSY, ESWL
Anesthesia: LOCAL | Laterality: Left

## 2024-11-04 DIAGNOSIS — N200 Calculus of kidney: Secondary | ICD-10-CM | POA: Diagnosis not present

## 2024-12-09 ENCOUNTER — Encounter: Payer: Self-pay | Admitting: Internal Medicine

## 2024-12-10 ENCOUNTER — Ambulatory Visit (INDEPENDENT_AMBULATORY_CARE_PROVIDER_SITE_OTHER): Admitting: Internal Medicine

## 2024-12-10 ENCOUNTER — Encounter (HOSPITAL_BASED_OUTPATIENT_CLINIC_OR_DEPARTMENT_OTHER): Payer: Self-pay | Admitting: Internal Medicine

## 2024-12-10 VITALS — BP 124/76 | HR 60 | Ht 62.0 in | Wt 140.3 lb

## 2024-12-10 DIAGNOSIS — E7841 Elevated Lipoprotein(a): Secondary | ICD-10-CM | POA: Diagnosis not present

## 2024-12-10 DIAGNOSIS — E785 Hyperlipidemia, unspecified: Secondary | ICD-10-CM | POA: Diagnosis not present

## 2024-12-10 NOTE — Patient Instructions (Addendum)
 Medication Instructions:   Your physician recommends that you continue on your current medications as directed. Please refer to the Current Medication list given to you today.  *If you need a refill on your cardiac medications before your next appointment, please call your pharmacy*    Testing/Procedures:  CARDIAC CALCIUM SCORE (SELF PAY)   Follow-Up:  TBD WITH DR. HILTY BASED ON THE RESULTS OF YOUR CALCIUM SCORE

## 2024-12-10 NOTE — Progress Notes (Signed)
 "   LIPID CLINIC CONSULT NOTE  Chief Complaint:  Elevated LP(a)  Primary Care Physician: Lauren Glade PARAS, MD  Primary Cardiologist:  None  HPI:  Lauren Reynolds is a 49 y.o. female who is being seen today for the evaluation of elevated LP(a) at the request of Burns, Glade PARAS, MD. this is a pleasant 49 year old female kindly referred for evaluation management of high cholesterol and elevated LP(a).  She has a history of high cholesterol and tells me back when she was in college she was noted to have some atherosclerosis.  Recently her lipids were elevated total cholesterol 248, triglycerides 65, HDL 75 and LDL 159.  She subsequently had an LP(a) test which is significantly elevated 455 nmol/L.  She was then scheduled for a coronary calcium score however that has not yet been performed.  In fact she thought she was here today for that calcium score study.  She tells me recently she has made dietary changes and reduce saturated fats and lost about 8 pounds.  She was already a vegan diet.  Overall she says she feels healthy.  PMHx:  Past Medical History:  Diagnosis Date   Allergy    Anxiety    GERD (gastroesophageal reflux disease)    History of COVID-19 03/2021   mild symptoms a ll symptoms resolved   History of kidney stones    Hyperlipidemia    Lynch syndrome    Stomach ulcer 08/2021   Wears glasses    for reading    Past Surgical History:  Procedure Laterality Date    laparoscopic surgery for tilted uterus  1995   ABLATION  2012   uterus   BREAST ENHANCEMENT SURGERY  2007   BREAST IMPLANT REMOVAL  2016   BREAST LUMPECTOMY WITH RADIOACTIVE SEED LOCALIZATION Right 08/08/2022   Procedure: RIGHT BREAST SEED LUMPECTOMY;  Surgeon: Lauren Ned, MD;  Location: [facility];  Service: General;  Laterality: Right;   CESAREAN SECTION     x2 2203 ans 2005   COLONOSCOPY  06/15/2021   normal   DILATION AND CURETTAGE OF UTERUS  2002   ESOPHAGOGASTRODUODENOSCOPY   09/19/2021   LAPAROSCOPIC VAGINAL HYSTERECTOMY WITH SALPINGO OOPHORECTOMY Bilateral 12/15/2021   Procedure: LAPAROSCOPIC ASSISTED VAGINAL HYSTERECTOMY WITH BILATERAL SALPINGO OOPHORECTOMY;  Surgeon: Lauren Medford, MD;  Location: [facility] [place];  Service: Gynecology;  Laterality: Bilateral;   TONSILLECTOMY     age 66    FAMHx:  Family History  Problem Relation Age of Onset   Breast cancer Mother 67       triple neg   Hyperlipidemia Mother    Thyroid  nodules Sister    Breast cancer Maternal Aunt        dx. 46s   Breast cancer Maternal Aunt        dx. 66s   Esophageal cancer Maternal Uncle    Esophageal cancer Maternal Uncle    Prostate cancer Paternal Uncle    Breast cancer Paternal Grandmother        dx. 110s   Colon cancer Paternal Grandmother        dx. 53s   Stomach cancer Neg Hx    Rectal cancer Neg Hx     SOCHx:   reports that she quit smoking about 33 years ago. Her smoking use included cigarettes. She started smoking about 35 years ago. She has a 2 pack-year smoking history. She has never used smokeless tobacco. She reports current alcohol use. She reports that she does not  use drugs.  ALLERGIES:  Allergies[1]  ROS: Pertinent items noted in HPI and remainder of comprehensive ROS otherwise negative.  HOME MEDS: Medications Ordered Prior to Encounter[2]  LABS/IMAGING: No results found for this or any previous visit (from the past 48 hours). No results found.  LIPID PANEL:    Component Value Date/Time   CHOL 248 (H) 10/15/2024 1140   TRIG 65.0 10/15/2024 1140   HDL 75.30 10/15/2024 1140   CHOLHDL 3 10/15/2024 1140   VLDL 13.0 10/15/2024 1140   LDLCALC 159 (H) 10/15/2024 1140    Lipoprotein (a)  Date/Time Value Ref Range Status  10/15/2024 11:40 AM 455 (H) <75 nmol/L Final    Comment:    . Risk Category   Optimal        < 75 nmol/L   Moderate   75 - 125 nmol/L   High          > 125 nmol/L . Cardiovascular event risk category cut  points (optimal, moderate, high) are based on Tsimika S. JACC 2017;69:692-711. .      WEIGHTS: Wt Readings from Last 3 Encounters:  12/10/24 140 lb 4.8 oz (63.6 kg)  10/15/24 141 lb (64 kg)  12/27/22 137 lb (62.1 kg)    VITALS: BP 124/76   Pulse 60   Ht 5' 2 (1.575 m)   Wt 140 lb 4.8 oz (63.6 kg)   SpO2 99%   BMI 25.66 kg/m   EXAM: Deferred  EKG: Deferred  ASSESSMENT: Dyslipidemia, goal LDL less than 70 Elevated OE(j)-544 nmol/L  PLAN: 1.   [name] has a dyslipidemia with a target LDL less than 70.  She has been working to try to lower cholesterol but I think it is unlikely with diet and exercise to be able to reach those levels.  This is especially true due to her high LP(a) which is genetic and not affected by diet and weight loss.  We talked about possible therapeutic options and that current recommendations suggest that she be on lipid-lowering therapy with statins to lower cardiovascular risk although the statins necessarily do not lower LP(a).  Ultimately would be ideal to consider PCSK9 inhibitor but she would not necessarily be approved for that if she has not already been on statin therapy.  I think her calcium score would be helpful for her as she has hesitation for being on medications, however if the calcium score is abnormal they may provide some more evidence to consider therapy.  I also discussed current clinical trials regarding LP(a) lowering.  If her calcium score is elevated with her LP(a) and age over 83 she might qualify for currently enrolling LP(a) specific trial.  Follow-up based on her calcium score results.  Thanks again for the kind referral.  Lauren KYM Maxcy, MD, [facility], FNLA, FACP  [place]  [facility]  Medical Director of the [facility] Lipid [facility] &  Cardiovascular Risk Reduction Clinic Diplomate of the American Board of Clinical Lipidology Attending Cardiologist  Direct Dial: [phone]  Fax: [phone]  Website:   [email or web address]  Lauren Reynolds 12/10/2024, 1:21 PM     [1]  Allergies Allergen Reactions   Pramoxine Rash   Neosporin [Bacitracin-Polymyxin B]    Prilosec [Omeprazole] Other (See Comments)    Worsening in vision   Benzalkonium Chloride Rash and Dermatitis   Codeine Nausea Only  [2]  Current Outpatient Medications on File Prior to Visit  Medication Sig Dispense Refill   Cetirizine HCl (ZYRTEC ALLERGY PO) Take by mouth.  Cholecalciferol (VITAMIN D ) 2000 units CAPS Take 1 capsule by mouth daily.     Cyanocobalamin  (B-12 PO) Take by mouth.     metroNIDAZOLE (METROCREAM) 0.75 % cream Apply topically as needed. PRN     NON FORMULARY Various mushrooms per patient     Turmeric (QC TUMERIC COMPLEX PO) Take by mouth.     estradiol (ESTRACE) 0.01 % CREA vaginal cream Insert 1 g twice a week by vaginal route at bedtime, for and apply as directed. (Patient not taking: Reported on 12/10/2024)     Omega-3 Fatty Acids (OMEGA-3 FISH OIL CONCENTRATE PO) Take by mouth. (Patient not taking: Reported on 12/10/2024)     No current facility-administered medications on file prior to visit.   "

## 2024-12-26 ENCOUNTER — Ambulatory Visit (HOSPITAL_BASED_OUTPATIENT_CLINIC_OR_DEPARTMENT_OTHER)
Admission: RE | Admit: 2024-12-26 | Discharge: 2024-12-26 | Disposition: A | Payer: Self-pay | Source: Ambulatory Visit | Attending: Internal Medicine | Admitting: Internal Medicine

## 2024-12-26 ENCOUNTER — Ambulatory Visit: Payer: Self-pay | Admitting: Cardiovascular Disease

## 2024-12-26 DIAGNOSIS — E7841 Elevated Lipoprotein(a): Secondary | ICD-10-CM | POA: Insufficient documentation

## 2024-12-26 DIAGNOSIS — E785 Hyperlipidemia, unspecified: Secondary | ICD-10-CM | POA: Insufficient documentation

## 2025-01-01 MED ORDER — ROSUVASTATIN CALCIUM 20 MG PO TABS: 20.0000 mg | ORAL_TABLET | Freq: Every day | ORAL | 3 refills | Status: AC

## 2025-01-14 ENCOUNTER — Encounter: Admitting: Internal Medicine
